# Patient Record
Sex: Male | Born: 1950 | Race: Black or African American | Hispanic: No | Marital: Married | Smoking: Former smoker
Health system: Southern US, Community
[De-identification: ages and names within clinical notes are randomized; demographics above are authoritative.]

## PROBLEM LIST (undated history)

## (undated) DIAGNOSIS — E785 Hyperlipidemia, unspecified: Secondary | ICD-10-CM

## (undated) DIAGNOSIS — Z9289 Personal history of other medical treatment: Secondary | ICD-10-CM

## (undated) DIAGNOSIS — M1990 Unspecified osteoarthritis, unspecified site: Secondary | ICD-10-CM

## (undated) DIAGNOSIS — Z8719 Personal history of other diseases of the digestive system: Secondary | ICD-10-CM

## (undated) DIAGNOSIS — I2510 Atherosclerotic heart disease of native coronary artery without angina pectoris: Secondary | ICD-10-CM

## (undated) DIAGNOSIS — I10 Essential (primary) hypertension: Secondary | ICD-10-CM

## (undated) DIAGNOSIS — K219 Gastro-esophageal reflux disease without esophagitis: Secondary | ICD-10-CM

## (undated) DIAGNOSIS — T7840XA Allergy, unspecified, initial encounter: Secondary | ICD-10-CM

## (undated) HISTORY — PX: BACK SURGERY: SHX140

## (undated) HISTORY — DX: Allergy, unspecified, initial encounter: T78.40XA

## (undated) HISTORY — DX: Unspecified osteoarthritis, unspecified site: M19.90

---

## 2002-08-09 ENCOUNTER — Encounter: Payer: Self-pay | Admitting: Cardiology

## 2002-08-09 ENCOUNTER — Encounter: Admission: RE | Admit: 2002-08-09 | Discharge: 2002-08-09 | Payer: Self-pay | Admitting: Cardiology

## 2003-09-04 ENCOUNTER — Encounter: Admission: RE | Admit: 2003-09-04 | Discharge: 2003-09-04 | Payer: Self-pay | Admitting: Cardiology

## 2004-10-06 DIAGNOSIS — Z9289 Personal history of other medical treatment: Secondary | ICD-10-CM

## 2004-10-06 HISTORY — PX: POSTERIOR LUMBAR FUSION: SHX6036

## 2004-10-06 HISTORY — DX: Personal history of other medical treatment: Z92.89

## 2004-10-06 HISTORY — PX: CARDIAC CATHETERIZATION: SHX172

## 2004-11-28 ENCOUNTER — Inpatient Hospital Stay (HOSPITAL_COMMUNITY): Admission: RE | Admit: 2004-11-28 | Discharge: 2004-11-30 | Payer: Self-pay | Admitting: Neurosurgery

## 2009-06-07 ENCOUNTER — Encounter: Admission: RE | Admit: 2009-06-07 | Discharge: 2009-06-07 | Payer: Self-pay | Admitting: Neurosurgery

## 2011-02-21 NOTE — Op Note (Signed)
NAME:  Billy Riley, Billy Riley             ACCOUNT NO.:  [id number]   MEDICAL RECORD NO.:  [id number]          PATIENT TYPE:  INP   LOCATION:  2861                         FACILITY:  [facility]   PHYSICIAN:  Hewitt Shorts, M.D.DATE OF BIRTH:  05/02/1951   DATE OF PROCEDURE:  11/28/2004  DATE OF DISCHARGE:                                 OPERATIVE REPORT   PREOPERATIVE DIAGNOSIS:  Lumbar spondylosis, lumbar degenerative disc  disease, lumbar radiculopathy, lumbar facet arthropathy, and lumbar neural  foraminal stenosis.   POSTOPERATIVE DIAGNOSIS:  Lumbar spondylosis, lumbar degenerative disc  disease, lumbar radiculopathy, lumbar facet arthropathy, and lumbar neural  foraminal stenosis.   PROCEDURE:  L5 Gill procedure, L5-S1 posterior lumbar interbody fusion with  AVS PEEK interbody implants and VITOSS with bone marrow aspirate, and  posterolateral arthrodesis with 90D posterior instrumentation and VITOSS  with bone marrow aspirate.   SURGEON:  Hewitt Shorts, M.D.   ASSISTANT:  Stefani Dama, M.D.   ANESTHESIA:  General endotracheal anesthesia.   INDICATIONS FOR PROCEDURE:  The patient is a 60 year old man who presented  with right lumbar radiculopathy who was found to have multilevel  degenerative disc disease with spondylosis with the worse degenerative  changes seen at the L5-S1 level and particularly, involving the right L5-S1  facet complex which was hypertrophic and in conjunction with spondylotic  degenerative disc protrusion which was causing severe right L5-S1 neural  foraminal encroachment or stenosis with right L5 nerve root compression.  The decision was made to proceed with decompression and arthrodesis.   DESCRIPTION OF PROCEDURE:  The patient was brought to the operating room and  placed under general endotracheal anesthesia.  The patient was turned to the  prone position and the lumbar region was prepped with Betadine soaping  solution and draped in a sterile  fashion.  The midline was infiltrated with  local anesthetic with epinephrine.  A midline incision was made and carried  down through the subcutaneous tissue.  Bipolar cautery and electrocautery  were used to maintain hemostasis.  Dissection was carried down to the lumbar  fascia which was incised bilaterally.  The paraspinal muscles were dissected  from the spinous process and lamina in a subperiosteal fashion.  The L5-S1  level was identified and an x-ray was taken to confirm the localization.  Then dissection was carried out laterally over the facet complexes, a self-  retaining retractor was placed, and then the microscope was draped and  brought onto the field to provide additional magnification, illumination,  and visualization, and the remainder of the decompression was performed  using microdissection and microsurgical technique.  Laminectomy and  facetectomy was performed with particular attention paid to the right L5-S1  facet complex which was markedly hypertrophic and dysmorphic.  We identified  the thecal sac, the ligamentum flavum was removed.  We identified the  exiting L5 and S1 nerve roots and we decompressed L5 nerve root as it exited  through the neural foramen removing hypertrophic facet, thereby,  decompressing the nerve.  We then further decompressed the nerve by  proceeding with a bilateral L5-S1 discectomy.  There was  spondylitic  overgrowth and broad based spondylotic disc protrusion, posterior  osteophytic overgrowth was removed using an osteophyte removal tool, the  disc space was entered and thorough discectomy performed removing all loose  fragments of disc material and as this was performed, we were able to  decompress the exiting right L5 nerve root both ventrally and dorsally.  We  then further prepared the disc spaces for the arthrodesis.  The  cartilaginous endplates of the corresponding vertebra were removed and  posterior osteophytic overgrowth was  removed.  We then sized the disc spaces  and selected 8 mm by 20 mm implants with 4 degrees of lordosis.  We then  identified the pedicles of S1, each of the pedicles was probed, and bone  marrow aspirate was aspirated and we injected this in 10 mL of VITOSS foam.  This was saturated with the bone marrow aspirate and took the VITOSS with  bone marrow aspirate and hacked into the PEEK interbody cages.  These cages  were then gently positioned in the L5-S1 disc spaces and carefully retracted  the thecal sac and nerve roots.  Each of the implants were counter sunk and  then we proceeded with the posterolateral arthrodesis.  We identified the  ala of S1 and the transverse processes of L5 which was decorticated.  We  then draped the C-arm fluoroscopic unit and it was brought onto the field.  We checked the positioning of our pedicle probes at S1.  These pedicles were  cannulated with a ball probe, no cut outs were found.  Each limb was tapped  with a 5.25 tap under C-arm fluoroscopic guidance.  Then, we placed 6.75 by  40 mm polyaxial screws bilaterally.  Then using C-arm fluoroscopic guidance,  we identified the pedicle entry site at L5, the overlying cortex was opened  and we probed the pedicles bilaterally at L5.  Again, no cut outs were  found.  They were tapped with a 5.25 tap and then we placed bilateral 6.75  by 40 mm screws at L5.  After tapping, each of the pedicles was checked and  good threading was found and no cut outs were found.  Once all four screws  were in place, we selected a 60 mm rod, it was cut roughly in half, and then  the rods were placed within the screw heads and the locking caps were  placed.  These were tightened against counter torque.  We then carefully  packed the additional VITOSS with bone aspirate in the lateral gutters  bilaterally.  Once that was completed, we examined the spinal canal one last time.  There remained good decompression of the thecal sac and  exiting nerve  roots, and then we proceeded with closure.  The deep fascia was closed with  interrupted undyed #1 Vicryl suture, the subcutaneous and subcuticular layer  was closed with interrupted inverted 2-0 Vicryl sutures, and the skin was  reapproximated with Dermabond.  The patient tolerated the procedure well.  Estimated blood loss was 300 mL.  We were able to give back to the patient  100 mL of Cell Saver blood.  Sponge and needle counts were correct.  Following surgery, the patient was turned back to the supine position,  reversed from the anesthetic, extubated, and transferred to the recovery  room for further care.      RWN/MEDQ  D:  11/28/2004  T:  11/28/2004  Job:  [id number]

## 2011-02-21 NOTE — H&P (Signed)
NAME:  Billy Riley, Billy Riley NO.:  [id number]   MEDICAL RECORD NO.:  [id number]          PATIENT TYPE:  INP   LOCATION:  2861                         FACILITY:  [facility]   PHYSICIAN:  Hewitt Shorts, M.D.DATE OF BIRTH:  05-05-1951   DATE OF ADMISSION:  11/28/2004  DATE OF DISCHARGE:                                HISTORY & PHYSICAL   HISTORY OF PRESENT ILLNESS:  The patient is a 60 year old right-handed black  male who was evaluated for a right lumbar radiculopathy.  He explains that  he has had low back and right lower extremity pain for the past three years  that has tended to come and go.  He has undergone chiropractic treatment  which would tend to help, but the benefit would wear off after just a few  days.  It has become less and less effective for him as well gradually over  time.  He has developed numbness and tingling that would run down through  the right lower extremity.  He describes the pain across the lower back into  the right side of the low back and into the right buttock, posterior thigh  and leg, numbness and tingling to the right buttock, posterior thigh and  leg, and occasionally a sense of weakness in the lower extremities.   The patient was evaluated with an MRI scan that shows moderately-advanced  degenerative joint disease and spondylosis at L4-5 and L5-S1 with  desiccation of the disks and circumferential disk bulging; however, there is  significant right L5-S1 facet arthropathy with significant right L5-S1  neural foraminal encroachment compressing the exiting right L5 nerve roots.  The patient is admitted now for L5 Gill procedure, L5-S1 posterior lumbar  interbody fusion and L5-S1 posterolateral arthrodesis with interbody  implants and posterior instrumentation.   PAST MEDICAL HISTORY:  Notable for a history of hiatal hernia, treated with  Protonix.  No history of hypertension, myocardial infarction, cancer,  stroke, diabetes or lung  disease.  No previous surgeries.   He has no allergies to medications, but aspirin can aggravate his  gastroesophageal reflux disease.   CURRENT MEDICATIONS:  1.  Protonix 40 mg daily.  2.  Flomax 0.4 mg daily.   FAMILY HISTORY:  His parents have passed on.   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  He smokes about four cigarettes a day.  He has been smoking  for 40 years.  He does not drink alcohol and denies a history of substance  abuse.   REVIEW OF SYSTEMS:  Notable for as described in his history of present  illness and past medical history but is otherwise unremarkable.   PHYSICAL EXAMINATION:  GENERAL:  The patient is a well-developed, well-  nourished black male in no acute distress.  VITAL SIGNS:  Temperature is 97.4, pulse 63, blood pressure 128/85,  respiratory rate 18.  Height is 5 feet 10 inches, weight 200 pounds.  CHEST:  Lungs are clear to auscultation.  His symmetrical respiratory  excursion.  CARDIAC:  Heart is a regular rate and rhythm, S1, S2.  There is no murmur.  ABDOMEN:  Soft, nontender, bowel sounds present.  EXTREMITIES:  No clubbing, cyanosis, or edema.  MUSCULOSKELETAL:  No particular tenderness to palpation of the lumbar  spinous processes or paralumbar musculature.  He is able to flex 90 degrees,  able to extend, but he has discomfort with both flexion and extension.  Straight leg raising is negative bilaterally, although on the right side  there was more tightness in the right lower extremity than in the left side.  NEUROLOGIC:  5/5 strength of the dorsiflexors, plantar flexors, extensor  hallucis longus bilaterally.  Sensation is intact to pinprick to the distal  lower extremities.  His reflexes are trace in the quadriceps and  gastrocnemius and symmetrical bilaterally.  The toes are downgoing  bilaterally.  He has a normal gait and stance.   IMPRESSION:  Low back and right lumbar radicular pain  secondary to right L5-  S1 neural foraminal encroachment with overlying facet arthropathy.  He  understands that the low back pain could be coming from degenerative changes  not only at the L5-S1 level but also at L4-5.   PLAN:  We discussed a variety of his options for treatment, including  medical treatment with NSAIDs, a course of spinal injections, or surgical  decompression and arthrodesis.  After discussing this thoroughly on several  occasions, the patient does wish to proceed with surgery and is admitted for  an L5 Gill procedure, L5-S1 posterior lumbar interbody fusion with interbody  implants and bone graft and L5-S1 posterolateral arthrodesis with posterior  instrumentation and bone graft.  Therefore, I have discussed the  difficulties of surgery, hospital stay and overall recuperation, limitations  postoperatively, the need for postoperative mobilization in a lumbar corset,  and risks of surgery including risks of infection, bleeding, possible need  for transfusion, the risk of nerve dysfunction with pain, weakness, numbness  or paresthesias, the risk of dural tear and CSF leakage with possible need  for further surgery, the risk of failure of the arthrodesis, particularly in  light of his smoking history, and the possible need for further surgery, and  anesthetic risks of myocardial infarction, stroke, pneumonia of death, again  all of which are increased due to his smoking history.  He has been strongly  encouraged to quit smoking, and he said that he would cease smoking.   He does understand that we will use a Cell Saver during the surgery, which  reduces the likelihood of requiring transfusion from the blood bank.  Understanding all this, he does wish to go ahead with surgery and is  admitted for such.      RWN/MEDQ  D:  11/28/2004  T:  11/28/2004  Job:  [id number]

## 2012-10-13 ENCOUNTER — Ambulatory Visit (INDEPENDENT_AMBULATORY_CARE_PROVIDER_SITE_OTHER): Payer: BC Managed Care – PPO | Admitting: Family Medicine

## 2012-10-13 ENCOUNTER — Encounter: Payer: Self-pay | Admitting: Family Medicine

## 2012-10-13 VITALS — BP 140/90 | HR 77 | Temp 98.2°F | Resp 16 | Ht 69.0 in | Wt 195.8 lb

## 2012-10-13 DIAGNOSIS — R202 Paresthesia of skin: Secondary | ICD-10-CM

## 2012-10-13 DIAGNOSIS — M5137 Other intervertebral disc degeneration, lumbosacral region: Secondary | ICD-10-CM

## 2012-10-13 DIAGNOSIS — F17200 Nicotine dependence, unspecified, uncomplicated: Secondary | ICD-10-CM

## 2012-10-13 DIAGNOSIS — M5136 Other intervertebral disc degeneration, lumbar region: Secondary | ICD-10-CM | POA: Insufficient documentation

## 2012-10-13 DIAGNOSIS — M51379 Other intervertebral disc degeneration, lumbosacral region without mention of lumbar back pain or lower extremity pain: Secondary | ICD-10-CM

## 2012-10-13 DIAGNOSIS — Z131 Encounter for screening for diabetes mellitus: Secondary | ICD-10-CM

## 2012-10-13 DIAGNOSIS — Z720 Tobacco use: Secondary | ICD-10-CM | POA: Insufficient documentation

## 2012-10-13 DIAGNOSIS — M79671 Pain in right foot: Secondary | ICD-10-CM

## 2012-10-13 DIAGNOSIS — R209 Unspecified disturbances of skin sensation: Secondary | ICD-10-CM

## 2012-10-13 DIAGNOSIS — M79609 Pain in unspecified limb: Secondary | ICD-10-CM

## 2012-10-13 LAB — POCT GLYCOSYLATED HEMOGLOBIN (HGB A1C): Hemoglobin A1C: 5.6

## 2012-10-13 LAB — GLUCOSE, POCT (MANUAL RESULT ENTRY): POC Glucose: 103 mg/dl — AB (ref 70–99)

## 2012-10-13 NOTE — Patient Instructions (Addendum)
I would suggest you see a podiatrist about your toenails as well as a complete foot evaluation; you may have a problem of the forefoot that can be corrected with better footwear or orthotics. Xrays of the feet can uncover some specific problem like a pinched nerve. Continue taking Advil for pain and wear cotton socks that can absorb excess sweat and keep feet cool.  If the foot specialist can not detect a reason for your foot problem, you need to return to our office for possible study of blood vessels in legs (tobacco use can cause disease in your blood vessels leading to poor circulation).

## 2012-10-13 NOTE — Progress Notes (Signed)
S:  This 62 y.o. AA has not been seen at [facility] in 5 years. 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 He presents w/ burning in feet (R>L) for about 1 month; the discomfort is localized to just beneath his great toe on right foot. Advil does relieve pain. Feet sweat a lot and removing shoes does help feet cool off. Wants to be checked for Diabetes. Pt is a smoker for 40 years. He does have cramping in right calf after walking for a few blocks; pt denies any change of skin color or clod sensation in feet or toes. He has hx of lumbar DDD and is s/p surgery (Dr. Newell Coral) with cadaver bone in Feb 2006.  ROS: Negative for unexplained weight change or appetite loss, fever, abnormal sensations in arms or upper legs, HA, dizziness, weakness, numbness, tremor, gait disturbance or syncope. HE denies any exposure to noxious or toxic substances.  O:  Filed Vitals:   10/13/12 1019  BP: 140/90  Pulse: 77  Temp: 98.2 F (36.8 C)  Resp: 16   GNE: In NAD; WN,WD. HENT: [place]/AT; EOMI w/ clear conj and muddy sclerae. EACs normal. Otherwise unremarkable. NECK: Supple w/o LAN or TMG. COR: RRR. Distal p ulses normal (DP 2+/=). LUNGS: Normal resp rate and effort. MS: Good ROM; no deformities noted. No calf tenderness.Feet- Normal appearance w/o ulcerations or other lesions. Tips of toes feel cool to touch. SKIN: Nails on both great toes- thick and discolored/ deformed. NEURO: A&O x 3; CNs intact. Nonfocal. Gait is normal.   Results for orders placed in visit on 10/13/12  POCT GLYCOSYLATED HEMOGLOBIN (HGB A1C)      Component Value Range   Hemoglobin A1C 5.6    GLUCOSE, POCT (MANUAL RESULT ENTRY)      Component Value Range   POC Glucose 103 (*) 70 - 99 mg/dl     A/P: 1. Paresthesias - may be related to previous DDD POCT glycosylated hemoglobin (Hb A1C), POCT glucose (manual entry)  2. Tobacco user  Advised pt that further evaluation may be necessary if Podiatry cannot  determine etiology for discomfort  3. DDD (degenerative disc disease), lumbar    4. Foot pain, right  Ambulatory referral to Podiatry

## 2012-12-05 ENCOUNTER — Ambulatory Visit (INDEPENDENT_AMBULATORY_CARE_PROVIDER_SITE_OTHER): Payer: BC Managed Care – PPO | Admitting: Emergency Medicine

## 2012-12-05 VITALS — BP 150/82 | HR 80 | Temp 98.0°F | Resp 18 | Ht 69.0 in | Wt 196.2 lb

## 2012-12-05 DIAGNOSIS — L03319 Cellulitis of trunk, unspecified: Secondary | ICD-10-CM

## 2012-12-05 DIAGNOSIS — L02219 Cutaneous abscess of trunk, unspecified: Secondary | ICD-10-CM

## 2012-12-05 MED ORDER — CIPROFLOXACIN HCL 500 MG PO TABS: 500.0000 mg | ORAL_TABLET | Freq: Two times a day (BID) | ORAL | Status: DC

## 2012-12-05 MED ORDER — SULFAMETHOXAZOLE-TRIMETHOPRIM 800-160 MG PO TABS: 1.0000 | ORAL_TABLET | Freq: Two times a day (BID) | ORAL | Status: DC

## 2012-12-05 NOTE — Patient Instructions (Addendum)
Abscess An abscess is an infected area that contains a collection of pus and debris. It can occur in almost any part of the body. An abscess is also known as a furuncle or boil. CAUSES   An abscess occurs when tissue gets infected. This can occur from blockage of oil or sweat glands, infection of hair follicles, or a minor injury to the skin. As the body tries to fight the infection, pus collects in the area and creates pressure under the skin. This pressure causes pain. People with weakened immune systems have difficulty fighting infections and get certain abscesses more often.   SYMPTOMS Usually an abscess develops on the skin and becomes a painful mass that is red, warm, and tender. If the abscess forms under the skin, you may feel a moveable soft area under the skin. Some abscesses break open (rupture) on their own, but most will continue to get worse without care. The infection can spread deeper into the body and eventually into the bloodstream, causing you to feel ill.   DIAGNOSIS   Your caregiver will take your medical history and perform a physical exam. A sample of fluid may also be taken from the abscess to determine what is causing your infection. TREATMENT   Your caregiver may prescribe antibiotic medicines to fight the infection. However, taking antibiotics alone usually does not cure an abscess. Your caregiver may need to make a small cut (incision) in the abscess to drain the pus. In some cases, gauze is packed into the abscess to reduce pain and to continue draining the area. HOME CARE INSTRUCTIONS    Only take over-the-counter or prescription medicines for pain, discomfort, or fever as directed by your caregiver.   If you were prescribed antibiotics, take them as directed. Finish them even if you start to feel better.   If gauze is used, follow your caregiver's directions for changing the gauze.   To avoid spreading the infection:   Keep your draining abscess covered with a  bandage.   Wash your hands well.   Do not share personal care items, towels, or whirlpools with others.   Avoid skin contact with others.   Keep your skin and clothes clean around the abscess.   Keep all follow-up appointments as directed by your caregiver.  SEEK MEDICAL CARE IF:    You have increased pain, swelling, redness, fluid drainage, or bleeding.   You have muscle aches, chills, or a general ill feeling.   You have a fever.  MAKE SURE YOU:    Understand these instructions.   Will watch your condition.   Will get help right away if you are not doing well or get worse.  Document Released: 07/02/2005 Document Revised: 03/23/2012 Document Reviewed: 12/05/2011 [employer] Patient Information 2013 [employer].    

## 2012-12-05 NOTE — Progress Notes (Signed)
[facility] [address] [place] [address] [phone]  Date:  12/05/2012   Name:  Billy Riley   DOB:  November 08, 1950   MRN:  [id number]  PCP:  No primary provider on file.    Chief Complaint: Boil   History of Present Illness:  Billy Riley is a 62 y.o. very pleasant male patient who presents with the following  Has a swollen, tender mass on the right perineum over past several days  No fever or chills.  No nausea or vomiting.  No GU or GI symptoms.   Patient Active Problem List  Diagnosis  . DDD (degenerative disc disease), lumbar  . Tobacco user    Past Medical History  Diagnosis Date  . Allergy   . Arthritis   . Blood transfusion without reported diagnosis     Past Surgical History  Procedure Laterality Date  . Spine surgery      History  Substance Use Topics  . Smoking status: Current Every Day Smoker  . Smokeless tobacco: Not on file  . Alcohol Use: No    Family History  Problem Relation Age of Onset  . Cancer Mother     breast  . Heart disease Father     heart attack  . Cancer Sister     breast  . Heart disease Brother     heart attack  . Diabetes Sister     Allergies  Allergen Reactions  . Asa (Aspirin)     Burns stomach    Medication list has been reviewed and updated.  Current Outpatient Prescriptions on File Prior to Visit  Medication Sig Dispense Refill  . OVER THE COUNTER MEDICATION Saw Palmetto 900 mg taking      . Nutritional Supplements (FEEDING SUPPLEMENT, GLUCERNA 1.2 CAL,) LIQD Place 1,000 mLs into feeding tube continuous.      Marland Kitchen OVER THE COUNTER MEDICATION Glucosamine 1000 mg taking      . OVER THE COUNTER MEDICATION Garlic Tablets 657 mg taking      . ranitidine (ZANTAC) 150 MG tablet Take 150 mg by mouth 2 (two) times daily.       No current facility-administered medications on file prior to visit.    Review of Systems:  As per HPI, otherwise negative.    Physical  Examination: Filed Vitals:   12/05/12 1232  BP: 150/82  Pulse: 80  Temp: 98 F (36.7 C)  Resp: 18   Filed Vitals:   12/05/12 1232  Height: 5\' 9"  (1.753 m)  Weight: 196 lb 3.2 oz (88.996 kg)   Body mass index is 28.96 kg/(m^2). Ideal Body Weight: Weight in (lb) to have BMI = 25: 168.9   GEN: WDWN, NAD, Non-toxic, Alert & Oriented x 3 HEENT: Atraumatic, Normocephalic.  Ears and Nose: No external deformity. EXTR: No clubbing/cyanosis/edema NEURO: Normal gait.  PSYCH: Normally interactive. Conversant. Not depressed or anxious appearing.  Calm demeanor.  PERINEUM:  Tender fluctuant abscess right perineum. GENITALIA:  Normal male circumcised   Assessment and Plan: Perineal abscess.  Refused I&D Septra CIPRO Warm soaks Follow up as needed  Carmelina Dane, MD

## 2015-01-08 ENCOUNTER — Ambulatory Visit (INDEPENDENT_AMBULATORY_CARE_PROVIDER_SITE_OTHER): Payer: BC Managed Care – PPO | Admitting: Physician Assistant

## 2015-01-08 VITALS — BP 140/90 | HR 74 | Temp 98.0°F | Resp 16 | Ht 70.0 in | Wt 196.2 lb

## 2015-01-08 DIAGNOSIS — B9789 Other viral agents as the cause of diseases classified elsewhere: Secondary | ICD-10-CM

## 2015-01-08 DIAGNOSIS — J029 Acute pharyngitis, unspecified: Secondary | ICD-10-CM | POA: Diagnosis not present

## 2015-01-08 DIAGNOSIS — J069 Acute upper respiratory infection, unspecified: Secondary | ICD-10-CM

## 2015-01-08 LAB — POCT CBC
GRANULOCYTE PERCENT: 62.6 % (ref 37–80)
HCT, POC: 46.7 % (ref 43.5–53.7)
Hemoglobin: 15.3 g/dL (ref 14.1–18.1)
Lymph, poc: 3.7 — AB (ref 0.6–3.4)
MCH, POC: 32.6 pg — AB (ref 27–31.2)
MCHC: 32.7 g/dL (ref 31.8–35.4)
MCV: 99.5 fL — AB (ref 80–97)
MID (CBC): 0.1 (ref 0–0.9)
MPV: 7.5 fL (ref 0–99.8)
PLATELET COUNT, POC: 259 10*3/uL (ref 142–424)
POC GRANULOCYTE: 6.4 (ref 2–6.9)
POC LYMPH %: 36.2 % (ref 10–50)
POC MID %: 1.2 %M (ref 0–12)
RBC: 4.69 M/uL (ref 4.69–6.13)
RDW, POC: 16.4 %
WBC: 10.2 10*3/uL (ref 4.6–10.2)

## 2015-01-08 LAB — POCT RAPID STREP A (OFFICE): Rapid Strep A Screen: NEGATIVE

## 2015-01-08 MED ORDER — IBUPROFEN 400 MG PO TABS: 400.0000 mg | ORAL_TABLET | Freq: Three times a day (TID) | ORAL | Status: DC | PRN

## 2015-01-08 MED ORDER — HYDROCODONE-HOMATROPINE 5-1.5 MG/5ML PO SYRP: 5.0000 mL | ORAL_SOLUTION | Freq: Three times a day (TID) | ORAL | Status: DC | PRN

## 2015-01-08 MED ORDER — ACETAMINOPHEN 500 MG PO TABS: 500.0000 mg | ORAL_TABLET | Freq: Four times a day (QID) | ORAL | Status: DC | PRN

## 2015-01-08 NOTE — Progress Notes (Signed)
01/08/2015 at 4:15 PM  Billy Riley / DOB: [birth year] / MRN: [id number]  The patient has DDD (degenerative disc disease), lumbar and Tobacco user on his problem list.  SUBJECTIVE  Chief complaint: Sore Throat   History of present illness: Billy Riley is 64 y.o. well appearing male presenting for sore throat that started three days ago.  He reports the pain as moderate and left sided.  He has been eating and drinking normally despite the pain.  He has tried several OTC medications with mild relief. He denies fever, but reports mild cough and a history of allergies. He thought his symptoms may be due to allergies, but felt he should come in because he usually does not suffer from sore throat during allergy season.    He  has a past medical history of Allergy; Arthritis; and Blood transfusion without reported diagnosis.    He has a current medication list which includes the following prescription(s): OVER THE COUNTER MEDICATION, OVER THE COUNTER MEDICATION, OVER THE COUNTER MEDICATION, ranitidine, acetaminophen, hydrocodone-homatropine, ibuprofen, and feeding supplement (glucerna 1.2 cal).  Billy Riley is allergic to asa. He  reports that he has been smoking.  He does not have any smokeless tobacco history on file. He reports that he does not drink alcohol or use illicit drugs. He  reports that he currently engages in sexual activity. The patient  has past surgical history that includes Spine surgery.  His family history includes Cancer in his mother and sister; Diabetes in his sister; Heart disease in his brother and father.  Review of Systems  Constitutional: Negative for fever, chills and weight loss.  Respiratory: Positive for cough. Negative for shortness of breath and wheezing.   Cardiovascular: Negative for chest pain and palpitations.  Gastrointestinal: Negative for heartburn.  Genitourinary: Negative.   Musculoskeletal: Negative.   Skin: Negative.   Neurological: Negative  for dizziness and headaches.  Endo/Heme/Allergies: Positive for environmental allergies.    OBJECTIVE  His  height is  (1.778 m) and weight is 196 lb 3.2 oz (88.996 kg). His oral temperature is 98 F (36.7 C). His blood pressure is 140/90 and his pulse is 74. His respiration is 16 and oxygen saturation is 98%.  The patient's body mass index is 28.15 kg/(m^2).  Physical Exam  Vitals reviewed. Constitutional: He is oriented to person, place, and time. He appears well-developed and well-nourished. No distress.  HENT:  Right Ear: Hearing, tympanic membrane, external ear and ear canal normal.  Left Ear: Hearing, tympanic membrane, external ear and ear canal normal.  Nose: No mucosal edema or sinus tenderness. Right sinus exhibits no maxillary sinus tenderness and no frontal sinus tenderness. Left sinus exhibits no maxillary sinus tenderness and no frontal sinus tenderness.  Mouth/Throat: Uvula is midline and mucous membranes are normal. Mucous membranes are not pale, not dry and not cyanotic. Posterior oropharyngeal erythema present. No oropharyngeal exudate, posterior oropharyngeal edema or tonsillar abscesses.  Cardiovascular: Normal rate and regular rhythm.   Respiratory: Effort normal and breath sounds normal. He has no wheezes. He has no rales.  Musculoskeletal: Normal range of motion.  Lymphadenopathy:    He has no cervical adenopathy.  Neurological: He is alert and oriented to person, place, and time.  Skin: Skin is warm and dry. He is not diaphoretic.  Psychiatric: He has a normal mood and affect.    Results for orders placed or performed in visit on 01/08/15 (from the past 24 hour(s))  POCT CBC  Status: Abnormal   Collection Time: 01/08/15  3:49 PM  Result Value Ref Range   WBC 10.2 4.6 - 10.2 K/uL   Lymph, poc 3.7 (A) 0.6 - 3.4   POC LYMPH PERCENT 36.2 10 - 50 %L   MID (cbc) 0.1 0 - 0.9   POC MID % 1.2 0 - 12 %M   POC Granulocyte 6.4 2 - 6.9   Granulocyte percent  62.6 37 - 80 %G   RBC 4.69 4.69 - 6.13 M/uL   Hemoglobin 15.3 14.1 - 18.1 g/dL   HCT, POC 04.546.7 40.943.5 - 53.7 %   MCV 99.5 (A) 80 - 97 fL   MCH, POC 32.6 (A) 27 - 31.2 pg   MCHC 32.7 31.8 - 35.4 g/dL   RDW, POC 81.116.4 %   Platelet Count, POC 259 142 - 424 K/uL   MPV 7.5 0 - 99.8 fL  POCT rapid strep A     Status: None   Collection Time: 01/08/15  3:49 PM  Result Value Ref Range   Rapid Strep A Screen Negative Negative    ASSESSMENT & PLAN  Billy Riley was seen today for sore throat.  Diagnoses and all orders for this visit:  Sore throat: Exam and vitals unremarkable. CBC with diff pointing in a viral direction given mild absolute lymphocytosis.  Will treat for a viral etiology with the below.  Throat culture pending.  Orders: -     POCT CBC -     POCT rapid strep A -     Culture, Group A Strep -     acetaminophen (TYLENOL) 500 MG tablet; Take 1 tablet (500 mg total) by mouth every 6 (six) hours as needed. -     ibuprofen (ADVIL,MOTRIN) 400 MG tablet; Take 1 tablet (400 mg total) by mouth every 8 (eight) hours as needed.  Viral URI with cough Orders: -     HYDROcodone-homatropine (HYCODAN) 5-1.5 MG/5ML syrup; Take 5 mLs by mouth every 8 (eight) hours as needed for cough.    The patient was advised to call or come back to clinic if he does not see an improvement in symptoms, or worsens with the above plan.   Deliah BostonMichael Ras Kollman, MHS, PA-C [facility] 01/08/2015 4:15 PM

## 2015-01-10 LAB — CULTURE, GROUP A STREP: Organism ID, Bacteria: NORMAL

## 2016-01-20 ENCOUNTER — Inpatient Hospital Stay (HOSPITAL_COMMUNITY)
Admission: EM | Admit: 2016-01-20 | Discharge: 2016-01-22 | DRG: 251 | Disposition: A | Discharge status: Home / Self Care | Payer: BC Managed Care – PPO | Attending: Internal Medicine | Admitting: Internal Medicine

## 2016-01-20 ENCOUNTER — Encounter (HOSPITAL_COMMUNITY): Payer: Self-pay | Admitting: Emergency Medicine

## 2016-01-20 ENCOUNTER — Emergency Department (HOSPITAL_COMMUNITY): Payer: BC Managed Care – PPO

## 2016-01-20 DIAGNOSIS — Z716 Tobacco abuse counseling: Secondary | ICD-10-CM

## 2016-01-20 DIAGNOSIS — Z8249 Family history of ischemic heart disease and other diseases of the circulatory system: Secondary | ICD-10-CM

## 2016-01-20 DIAGNOSIS — Z87891 Personal history of nicotine dependence: Secondary | ICD-10-CM | POA: Diagnosis present

## 2016-01-20 DIAGNOSIS — M51369 Other intervertebral disc degeneration, lumbar region without mention of lumbar back pain or lower extremity pain: Secondary | ICD-10-CM | POA: Diagnosis present

## 2016-01-20 DIAGNOSIS — K449 Diaphragmatic hernia without obstruction or gangrene: Secondary | ICD-10-CM | POA: Diagnosis present

## 2016-01-20 DIAGNOSIS — F17200 Nicotine dependence, unspecified, uncomplicated: Secondary | ICD-10-CM | POA: Diagnosis present

## 2016-01-20 DIAGNOSIS — I209 Angina pectoris, unspecified: Secondary | ICD-10-CM | POA: Diagnosis present

## 2016-01-20 DIAGNOSIS — I2510 Atherosclerotic heart disease of native coronary artery without angina pectoris: Secondary | ICD-10-CM | POA: Diagnosis present

## 2016-01-20 DIAGNOSIS — I161 Hypertensive emergency: Secondary | ICD-10-CM | POA: Diagnosis present

## 2016-01-20 DIAGNOSIS — Z803 Family history of malignant neoplasm of breast: Secondary | ICD-10-CM

## 2016-01-20 DIAGNOSIS — I252 Old myocardial infarction: Secondary | ICD-10-CM | POA: Diagnosis present

## 2016-01-20 DIAGNOSIS — Z7902 Long term (current) use of antithrombotics/antiplatelets: Secondary | ICD-10-CM

## 2016-01-20 DIAGNOSIS — Z888 Allergy status to other drugs, medicaments and biological substances status: Secondary | ICD-10-CM

## 2016-01-20 DIAGNOSIS — E785 Hyperlipidemia, unspecified: Secondary | ICD-10-CM | POA: Diagnosis present

## 2016-01-20 DIAGNOSIS — I160 Hypertensive urgency: Secondary | ICD-10-CM | POA: Diagnosis present

## 2016-01-20 DIAGNOSIS — I214 Non-ST elevation (NSTEMI) myocardial infarction: Secondary | ICD-10-CM | POA: Diagnosis not present

## 2016-01-20 DIAGNOSIS — R011 Cardiac murmur, unspecified: Secondary | ICD-10-CM | POA: Diagnosis present

## 2016-01-20 DIAGNOSIS — R079 Chest pain, unspecified: Secondary | ICD-10-CM

## 2016-01-20 DIAGNOSIS — K219 Gastro-esophageal reflux disease without esophagitis: Secondary | ICD-10-CM | POA: Diagnosis present

## 2016-01-20 DIAGNOSIS — Z720 Tobacco use: Secondary | ICD-10-CM | POA: Diagnosis present

## 2016-01-20 DIAGNOSIS — I358 Other nonrheumatic aortic valve disorders: Secondary | ICD-10-CM

## 2016-01-20 DIAGNOSIS — R778 Other specified abnormalities of plasma proteins: Secondary | ICD-10-CM | POA: Diagnosis present

## 2016-01-20 DIAGNOSIS — Z9861 Coronary angioplasty status: Secondary | ICD-10-CM

## 2016-01-20 DIAGNOSIS — R7989 Other specified abnormal findings of blood chemistry: Secondary | ICD-10-CM | POA: Diagnosis present

## 2016-01-20 DIAGNOSIS — M5136 Other intervertebral disc degeneration, lumbar region: Secondary | ICD-10-CM | POA: Diagnosis present

## 2016-01-20 HISTORY — DX: Essential (primary) hypertension: I10

## 2016-01-20 HISTORY — DX: Gastro-esophageal reflux disease without esophagitis: K21.9

## 2016-01-20 HISTORY — DX: Personal history of other medical treatment: Z92.89

## 2016-01-20 HISTORY — DX: Personal history of other diseases of the digestive system: Z87.19

## 2016-01-20 HISTORY — DX: Atherosclerotic heart disease of native coronary artery without angina pectoris: I25.10

## 2016-01-20 HISTORY — DX: Hyperlipidemia, unspecified: E78.5

## 2016-01-20 LAB — BASIC METABOLIC PANEL
Anion gap: 12 (ref 5–15)
BUN: 8 mg/dL (ref 6–20)
CO2: 24 mmol/L (ref 22–32)
Calcium: 9.3 mg/dL (ref 8.9–10.3)
Chloride: 103 mmol/L (ref 101–111)
Creatinine, Ser: 1.27 mg/dL — ABNORMAL HIGH (ref 0.61–1.24)
GFR calc Af Amer: >60 mL/min (ref >60)
GFR calc non Af Amer: 58 mL/min — ABNORMAL LOW (ref >60)
GLUCOSE: 147 mg/dL — AB (ref 65–99)
Potassium: 3.7 mmol/L (ref 3.5–5.1)
Sodium: 139 mmol/L (ref 135–145)

## 2016-01-20 LAB — CBC
HCT: 41.6 % (ref 39.0–52.0)
HEMOGLOBIN: 13.9 g/dL (ref 13.0–17.0)
MCH: 32.5 pg (ref 26.0–34.0)
MCHC: 33.4 g/dL (ref 30.0–36.0)
MCV: 97.2 fL (ref 78.0–100.0)
Platelets: 233 10*3/uL (ref 150–400)
RBC: 4.28 MIL/uL (ref 4.22–5.81)
RDW: 14.8 % (ref 11.5–15.5)
WBC: 10.9 10*3/uL — AB (ref 4.0–10.5)

## 2016-01-20 LAB — I-STAT TROPONIN, ED: Troponin i, poc: 0 ng/mL (ref 0.00–0.08)

## 2016-01-20 NOTE — ED Notes (Signed)
Pt. reports intermittent central chest pain onset last night , denies SOB , no nausea or diaphoresis . Described pain as " burning " .

## 2016-01-21 ENCOUNTER — Encounter (HOSPITAL_COMMUNITY)
Admission: EM | Disposition: A | Discharge status: Home / Self Care | Payer: Self-pay | Source: Home / Self Care | Attending: Internal Medicine

## 2016-01-21 ENCOUNTER — Encounter (HOSPITAL_COMMUNITY): Payer: Self-pay | Admitting: Student

## 2016-01-21 DIAGNOSIS — I252 Old myocardial infarction: Secondary | ICD-10-CM | POA: Diagnosis present

## 2016-01-21 DIAGNOSIS — R011 Cardiac murmur, unspecified: Secondary | ICD-10-CM | POA: Diagnosis present

## 2016-01-21 DIAGNOSIS — F17200 Nicotine dependence, unspecified, uncomplicated: Secondary | ICD-10-CM | POA: Diagnosis not present

## 2016-01-21 DIAGNOSIS — R7989 Other specified abnormal findings of blood chemistry: Secondary | ICD-10-CM | POA: Diagnosis present

## 2016-01-21 DIAGNOSIS — Z888 Allergy status to other drugs, medicaments and biological substances status: Secondary | ICD-10-CM | POA: Diagnosis not present

## 2016-01-21 DIAGNOSIS — K449 Diaphragmatic hernia without obstruction or gangrene: Secondary | ICD-10-CM | POA: Diagnosis present

## 2016-01-21 DIAGNOSIS — I160 Hypertensive urgency: Secondary | ICD-10-CM

## 2016-01-21 DIAGNOSIS — I209 Angina pectoris, unspecified: Secondary | ICD-10-CM | POA: Diagnosis not present

## 2016-01-21 DIAGNOSIS — I214 Non-ST elevation (NSTEMI) myocardial infarction: Secondary | ICD-10-CM | POA: Diagnosis not present

## 2016-01-21 DIAGNOSIS — I161 Hypertensive emergency: Secondary | ICD-10-CM | POA: Diagnosis not present

## 2016-01-21 DIAGNOSIS — I2510 Atherosclerotic heart disease of native coronary artery without angina pectoris: Secondary | ICD-10-CM | POA: Diagnosis present

## 2016-01-21 DIAGNOSIS — Z7902 Long term (current) use of antithrombotics/antiplatelets: Secondary | ICD-10-CM | POA: Diagnosis not present

## 2016-01-21 DIAGNOSIS — I358 Other nonrheumatic aortic valve disorders: Secondary | ICD-10-CM

## 2016-01-21 DIAGNOSIS — Z87891 Personal history of nicotine dependence: Secondary | ICD-10-CM | POA: Diagnosis present

## 2016-01-21 DIAGNOSIS — E785 Hyperlipidemia, unspecified: Secondary | ICD-10-CM | POA: Diagnosis not present

## 2016-01-21 DIAGNOSIS — Z716 Tobacco abuse counseling: Secondary | ICD-10-CM | POA: Diagnosis not present

## 2016-01-21 DIAGNOSIS — Z8249 Family history of ischemic heart disease and other diseases of the circulatory system: Secondary | ICD-10-CM | POA: Diagnosis not present

## 2016-01-21 DIAGNOSIS — Z803 Family history of malignant neoplasm of breast: Secondary | ICD-10-CM | POA: Diagnosis not present

## 2016-01-21 DIAGNOSIS — R778 Other specified abnormalities of plasma proteins: Secondary | ICD-10-CM | POA: Diagnosis present

## 2016-01-21 DIAGNOSIS — K219 Gastro-esophageal reflux disease without esophagitis: Secondary | ICD-10-CM | POA: Diagnosis present

## 2016-01-21 HISTORY — PX: CARDIAC CATHETERIZATION: SHX172

## 2016-01-21 HISTORY — PX: CORONARY ANGIOPLASTY: SHX604

## 2016-01-21 LAB — PROTIME-INR
INR: 1.07 (ref 0.00–1.49)
PROTHROMBIN TIME: 14.1 s (ref 11.6–15.2)

## 2016-01-21 LAB — COMPREHENSIVE METABOLIC PANEL
ALK PHOS: 76 U/L (ref 38–126)
ALT: 18 U/L (ref 17–63)
AST: 26 U/L (ref 15–41)
Albumin: 3.8 g/dL (ref 3.5–5.0)
Anion gap: 9 (ref 5–15)
BUN: 8 mg/dL (ref 6–20)
CALCIUM: 9.1 mg/dL (ref 8.9–10.3)
CHLORIDE: 106 mmol/L (ref 101–111)
CO2: 23 mmol/L (ref 22–32)
CREATININE: 1.22 mg/dL (ref 0.61–1.24)
Glucose, Bld: 128 mg/dL — ABNORMAL HIGH (ref 65–99)
Potassium: 3.7 mmol/L (ref 3.5–5.1)
SODIUM: 138 mmol/L (ref 135–145)
Total Bilirubin: 0.5 mg/dL (ref 0.3–1.2)
Total Protein: 6.2 g/dL — ABNORMAL LOW (ref 6.5–8.1)

## 2016-01-21 LAB — CBC
HCT: 40.2 % (ref 39.0–52.0)
Hemoglobin: 13.5 g/dL (ref 13.0–17.0)
MCH: 32.4 pg (ref 26.0–34.0)
MCHC: 33.6 g/dL (ref 30.0–36.0)
MCV: 96.4 fL (ref 78.0–100.0)
PLATELETS: 195 10*3/uL (ref 150–400)
RBC: 4.17 MIL/uL — AB (ref 4.22–5.81)
RDW: 14.7 % (ref 11.5–15.5)
WBC: 9.7 10*3/uL (ref 4.0–10.5)

## 2016-01-21 LAB — I-STAT TROPONIN, ED: Troponin i, poc: 0.16 ng/mL (ref 0.00–0.08)

## 2016-01-21 LAB — POCT ACTIVATED CLOTTING TIME: Activated Clotting Time: 446 seconds

## 2016-01-21 LAB — TROPONIN I
TROPONIN I: 0.56 ng/mL — AB (ref <0.031)
TROPONIN I: 1.15 ng/mL — AB (ref <0.031)
Troponin I: 2.91 ng/mL (ref <0.031)
Troponin I: 3.99 ng/mL (ref <0.031)

## 2016-01-21 LAB — LIPID PANEL
Cholesterol: 190 mg/dL (ref 0–200)
HDL: 39 mg/dL — ABNORMAL LOW (ref >40)
LDL CALC: 133 mg/dL — AB (ref 0–99)
TRIGLYCERIDES: 89 mg/dL (ref <150)
Total CHOL/HDL Ratio: 4.9 RATIO
VLDL: 18 mg/dL (ref 0–40)

## 2016-01-21 LAB — TSH: TSH: 0.998 u[IU]/mL (ref 0.350–4.500)

## 2016-01-21 SURGERY — LEFT HEART CATH AND CORONARY ANGIOGRAPHY
Anesthesia: LOCAL

## 2016-01-21 MED ORDER — VERAPAMIL HCL 2.5 MG/ML IV SOLN
INTRAVENOUS | Status: DC | PRN
  Administered 2016-01-21: 10 mL via INTRA_ARTERIAL

## 2016-01-21 MED ORDER — HEPARIN (PORCINE) IN NACL 100-0.45 UNIT/ML-% IJ SOLN
1250.0000 [IU]/h | INTRAMUSCULAR | Status: DC
  Administered 2016-01-21: 1250 [IU]/h via INTRAVENOUS
  Filled 2016-01-21: qty 250

## 2016-01-21 MED ORDER — HEPARIN SODIUM (PORCINE) 1000 UNIT/ML IJ SOLN
INTRAMUSCULAR | Status: AC
  Filled 2016-01-21: qty 1

## 2016-01-21 MED ORDER — HEPARIN BOLUS VIA INFUSION
4000.0000 [IU] | Freq: Once | INTRAVENOUS | Status: AC
  Administered 2016-01-21: 4000 [IU] via INTRAVENOUS
  Filled 2016-01-21: qty 4000

## 2016-01-21 MED ORDER — NITROGLYCERIN 1 MG/10 ML FOR IR/CATH LAB
INTRA_ARTERIAL | Status: AC
  Filled 2016-01-21: qty 10

## 2016-01-21 MED ORDER — MIDAZOLAM HCL 2 MG/2ML IJ SOLN
INTRAMUSCULAR | Status: AC
  Filled 2016-01-21: qty 2

## 2016-01-21 MED ORDER — SODIUM CHLORIDE 0.9% FLUSH: 3.0000 mL | Freq: Two times a day (BID) | INTRAVENOUS | Status: DC

## 2016-01-21 MED ORDER — SODIUM CHLORIDE 0.9 % IV SOLN
250.0000 mg | INTRAVENOUS | Status: DC | PRN
  Administered 2016-01-21: 1.75 mg/kg/h via INTRAVENOUS

## 2016-01-21 MED ORDER — TICAGRELOR 90 MG PO TABS
ORAL_TABLET | ORAL | Status: AC
  Filled 2016-01-21: qty 1

## 2016-01-21 MED ORDER — SODIUM CHLORIDE 0.9% FLUSH: 3.0000 mL | INTRAVENOUS | Status: DC | PRN

## 2016-01-21 MED ORDER — MIDAZOLAM HCL 2 MG/2ML IJ SOLN
INTRAMUSCULAR | Status: DC | PRN
  Administered 2016-01-21: 2 mg via INTRAVENOUS

## 2016-01-21 MED ORDER — CLOPIDOGREL BISULFATE 75 MG PO TABS
150.0000 mg | ORAL_TABLET | Freq: Once | ORAL | Status: AC
  Administered 2016-01-21: 150 mg via ORAL
  Filled 2016-01-21: qty 2

## 2016-01-21 MED ORDER — FENTANYL CITRATE (PF) 100 MCG/2ML IJ SOLN
INTRAMUSCULAR | Status: AC
  Filled 2016-01-21: qty 2

## 2016-01-21 MED ORDER — ATORVASTATIN CALCIUM 80 MG PO TABS
80.0000 mg | ORAL_TABLET | Freq: Every day | ORAL | Status: DC
  Administered 2016-01-21: 80 mg via ORAL
  Filled 2016-01-21: qty 1

## 2016-01-21 MED ORDER — SODIUM CHLORIDE 0.9 % WEIGHT BASED INFUSION
3.0000 mL/kg/h | INTRAVENOUS | Status: AC
  Administered 2016-01-21 (×2): 3 mL/kg/h via INTRAVENOUS

## 2016-01-21 MED ORDER — BIVALIRUDIN 250 MG IV SOLR
INTRAVENOUS | Status: AC
  Filled 2016-01-21: qty 250

## 2016-01-21 MED ORDER — SODIUM CHLORIDE 0.9 % WEIGHT BASED INFUSION
1.0000 mL/kg/h | INTRAVENOUS | Status: DC
  Administered 2016-01-21 (×2): 1 mL/kg/h via INTRAVENOUS

## 2016-01-21 MED ORDER — TICAGRELOR 90 MG PO TABS
90.0000 mg | ORAL_TABLET | Freq: Two times a day (BID) | ORAL | Status: DC
  Administered 2016-01-22 (×2): 90 mg via ORAL
  Filled 2016-01-21 (×2): qty 1

## 2016-01-21 MED ORDER — ACETAMINOPHEN 325 MG PO TABS: 650.0000 mg | ORAL_TABLET | Freq: Four times a day (QID) | ORAL | Status: DC | PRN

## 2016-01-21 MED ORDER — NITROGLYCERIN 1 MG/10 ML FOR IR/CATH LAB
INTRA_ARTERIAL | Status: DC | PRN
  Administered 2016-01-21 (×2): 200 ug via INTRA_ARTERIAL

## 2016-01-21 MED ORDER — SODIUM CHLORIDE 0.9 % IV SOLN: 250.0000 mL | INTRAVENOUS | Status: DC | PRN

## 2016-01-21 MED ORDER — GI COCKTAIL ~~LOC~~
30.0000 mL | Freq: Once | ORAL | Status: AC
  Administered 2016-01-21: 30 mL via ORAL
  Filled 2016-01-21: qty 30

## 2016-01-21 MED ORDER — HEPARIN (PORCINE) IN NACL 2-0.9 UNIT/ML-% IJ SOLN
INTRAMUSCULAR | Status: AC
  Filled 2016-01-21: qty 1000

## 2016-01-21 MED ORDER — IOPAMIDOL (ISOVUE-370) INJECTION 76%
INTRAVENOUS | Status: AC
  Filled 2016-01-21: qty 100

## 2016-01-21 MED ORDER — FENTANYL CITRATE (PF) 100 MCG/2ML IJ SOLN
INTRAMUSCULAR | Status: DC | PRN
  Administered 2016-01-21: 50 ug via INTRAVENOUS

## 2016-01-21 MED ORDER — LABETALOL HCL 5 MG/ML IV SOLN
20.0000 mg | Freq: Once | INTRAVENOUS | Status: AC
Start: 2016-01-21 — End: 2016-01-21
  Administered 2016-01-21: 20 mg via INTRAVENOUS
  Filled 2016-01-21: qty 4

## 2016-01-21 MED ORDER — VERAPAMIL HCL 2.5 MG/ML IV SOLN
INTRAVENOUS | Status: AC
  Filled 2016-01-21: qty 2

## 2016-01-21 MED ORDER — LIDOCAINE HCL (PF) 1 % IJ SOLN
INTRAMUSCULAR | Status: DC | PRN
  Administered 2016-01-21: 3 mL via INTRADERMAL

## 2016-01-21 MED ORDER — LIDOCAINE HCL (PF) 1 % IJ SOLN
INTRAMUSCULAR | Status: AC
  Filled 2016-01-21: qty 30

## 2016-01-21 MED ORDER — NITROGLYCERIN 0.4 MG SL SUBL
0.4000 mg | SUBLINGUAL_TABLET | SUBLINGUAL | Status: DC | PRN
  Administered 2016-01-21 (×2): 0.4 mg via SUBLINGUAL
  Filled 2016-01-21: qty 1

## 2016-01-21 MED ORDER — ASPIRIN 81 MG PO CHEW: 81.0000 mg | CHEWABLE_TABLET | ORAL | Status: DC

## 2016-01-21 MED ORDER — SODIUM CHLORIDE 0.9 % WEIGHT BASED INFUSION
3.0000 mL/kg/h | INTRAVENOUS | Status: DC
  Administered 2016-01-21: 3 mL/kg/h via INTRAVENOUS

## 2016-01-21 MED ORDER — HEPARIN (PORCINE) IN NACL 2-0.9 UNIT/ML-% IJ SOLN
INTRAMUSCULAR | Status: DC | PRN
  Administered 2016-01-21: 1000 mL via INTRA_ARTERIAL

## 2016-01-21 MED ORDER — HEPARIN SODIUM (PORCINE) 1000 UNIT/ML IJ SOLN
INTRAMUSCULAR | Status: DC | PRN
  Administered 2016-01-21: 4500 [IU] via INTRAVENOUS

## 2016-01-21 MED ORDER — NITROGLYCERIN IN D5W 200-5 MCG/ML-% IV SOLN
2.0000 ug/min | INTRAVENOUS | Status: DC
  Administered 2016-01-21: 30 ug/min via INTRAVENOUS
  Administered 2016-01-21: 20 ug/min via INTRAVENOUS
  Administered 2016-01-21: 5 ug/min via INTRAVENOUS
  Administered 2016-01-21: 10 ug/min via INTRAVENOUS
  Administered 2016-01-21: 5 ug/min via INTRAVENOUS
  Administered 2016-01-21: 25 ug/min via INTRAVENOUS
  Filled 2016-01-21: qty 250

## 2016-01-21 MED ORDER — TICAGRELOR 90 MG PO TABS
ORAL_TABLET | ORAL | Status: DC | PRN
  Administered 2016-01-21: 180 mg via ORAL

## 2016-01-21 MED ORDER — CLONIDINE HCL 0.2 MG PO TABS
0.2000 mg | ORAL_TABLET | Freq: Once | ORAL | Status: AC
  Administered 2016-01-21: 0.2 mg via ORAL
  Filled 2016-01-21: qty 1

## 2016-01-21 MED ORDER — ASPIRIN EC 81 MG PO TBEC
81.0000 mg | DELAYED_RELEASE_TABLET | Freq: Every day | ORAL | Status: DC
  Administered 2016-01-21 – 2016-01-22 (×2): 81 mg via ORAL
  Filled 2016-01-21 (×2): qty 1

## 2016-01-21 MED ORDER — BIVALIRUDIN BOLUS VIA INFUSION - CUPID
INTRAVENOUS | Status: DC | PRN
  Administered 2016-01-21: 66.825 mg via INTRAVENOUS

## 2016-01-21 MED ORDER — PANTOPRAZOLE SODIUM 40 MG IV SOLR
40.0000 mg | Freq: Two times a day (BID) | INTRAVENOUS | Status: DC
  Administered 2016-01-21 – 2016-01-22 (×3): 40 mg via INTRAVENOUS
  Filled 2016-01-21 (×4): qty 40

## 2016-01-21 MED ORDER — METOPROLOL TARTRATE 12.5 MG HALF TABLET
12.5000 mg | ORAL_TABLET | Freq: Two times a day (BID) | ORAL | Status: DC
  Administered 2016-01-21: 22:00:00 12.5 mg via ORAL
  Filled 2016-01-21: qty 1

## 2016-01-21 MED ORDER — MORPHINE SULFATE (PF) 4 MG/ML IV SOLN
4.0000 mg | Freq: Once | INTRAVENOUS | Status: AC
  Administered 2016-01-21: 4 mg via INTRAVENOUS
  Filled 2016-01-21: qty 1

## 2016-01-21 SURGICAL SUPPLY — 19 items
BALLN EUPHORA RX 2.0X12 (BALLOONS) ×2
BALLN EUPHORA RX 2.0X20 (BALLOONS) ×2
BALLOON EUPHORA RX 2.0X12 (BALLOONS) IMPLANT
BALLOON EUPHORA RX 2.0X20 (BALLOONS) IMPLANT
CATH INFINITI 5FR ANG PIGTAIL (CATHETERS) ×1 IMPLANT
CATH INFINITI JR4 5F (CATHETERS) ×1 IMPLANT
CATH LAUNCHER 5F RADR (CATHETERS) IMPLANT
CATH OPTITORQUE TIG 4.0 5F (CATHETERS) ×1 IMPLANT
CATH VISTA GUIDE 6FR XBLAD3.5 (CATHETERS) ×1 IMPLANT
CATHETER LAUNCHER 5F RADR (CATHETERS) ×2
DEVICE RAD COMP TR BAND LRG (VASCULAR PRODUCTS) ×1 IMPLANT
GLIDESHEATH SLEND A-KIT 6F 22G (SHEATH) ×1 IMPLANT
KIT ENCORE 26 ADVANTAGE (KITS) ×1 IMPLANT
KIT HEART LEFT (KITS) ×2 IMPLANT
PACK CARDIAC CATHETERIZATION (CUSTOM PROCEDURE TRAY) ×2 IMPLANT
TRANSDUCER W/STOPCOCK (MISCELLANEOUS) ×2 IMPLANT
TUBING CIL FLEX 10 FLL-RA (TUBING) ×2 IMPLANT
WIRE INTUITION HYDRO ST. 180CM (WIRE) ×1 IMPLANT
WIRE SAFE-T 1.5MM-J .035X260CM (WIRE) ×1 IMPLANT

## 2016-01-21 NOTE — Progress Notes (Signed)
Hospital Problem List     Principal Problem:   NSTEMI (non-ST elevated myocardial infarction) (HCC) Active Problems:   DDD (degenerative disc disease), lumbar   Tobacco user   Hypertensive urgency   Ischemic chest pain (HCC)   Tobacco dependence   Systolic murmur of aorta    Patient Profile:   Primary Cardiologist: New  65 yo male w/ PMH of tobacco abuse and no prior history of CAD who [facility] this [facility] for evaluation of chest pain. Initial troponin elevated to 0.56.   Subjective   Reports chest pain resolved after taking Protonix and being started on NTG Drip. Denies any associated dyspnea, diaphoresis, nausea, or vomiting.  Inpatient Medications    . aspirin EC  81 mg Oral Daily  . atorvastatin  80 mg Oral q1800  . metoprolol tartrate  12.5 mg Oral BID  . pantoprazole (PROTONIX) IV  40 mg Intravenous Q12H  . sodium chloride flush  3 mL Intravenous Q12H    Vital Signs    Filed Vitals:   01/21/16 0515 01/21/16 0546 01/21/16 0741 01/21/16 0932  BP: 158/90 186/67 127/74 137/83  Pulse: 67 61 74 70  Temp:  98.2 F (36.8 C)    TempSrc:  Oral    Resp: 15 16    Height:  5\' 10"  (1.778 m)    Weight:  196 lb 6.9 oz (89.1 kg)    SpO2: 99% 98%     No intake or output data in the 24 hours ending 01/21/16 1043 Filed Weights   01/20/16 2152 01/21/16 0546  Weight: 198 lb (89.812 kg) 196 lb 6.9 oz (89.1 kg)    Physical Exam    General: Well developed, well nourished, male appearing in no acute distress. Head: Normocephalic, atraumatic.  Neck: Supple without bruits, JVD not elevated. Lungs:  Resp regular and unlabored, CTA without wheezing or rales. Heart: RRR, S1, S2, no S3, S4, 2/6 SEM at RUSB; no rub. Abdomen: Soft, non-tender, non-distended with normoactive bowel sounds. No hepatomegaly. No rebound/guarding. No obvious abdominal masses. Extremities: No clubbing, cyanosis, or edema. Distal pedal pulses are 2+ bilaterally. Neuro: Alert and  oriented X 3. Moves all extremities spontaneously. Psych: Normal affect.  Labs    CBC  Recent Labs  01/20/16 2212 01/21/16 0617  WBC 10.9* 9.7  HGB 13.9 13.5  HCT 41.6 40.2  MCV 97.2 96.4  PLT 233 195   Basic Metabolic Panel  Recent Labs  01/20/16 2212 01/21/16 0617  NA 139 138  K 3.7 3.7  CL 103 106  CO2 24 23  GLUCOSE 147* 128*  BUN 8 8  CREATININE 1.27* 1.22  CALCIUM 9.3 9.1   Liver Function Tests  Recent Labs  01/21/16 0617  AST 26  ALT 18  ALKPHOS 76  BILITOT 0.5  PROT 6.2*  ALBUMIN 3.8   No results for input(s): LIPASE, AMYLASE in the last 72 hours. Cardiac Enzymes  Recent Labs  01/21/16 0245 01/21/16 0617  TROPONINI 0.56* 1.15*   BNP Invalid input(s): POCBNP D-Dimer No results for input(s): DDIMER in the last 72 hours. Hemoglobin A1C No results for input(s): HGBA1C in the last 72 hours. Fasting Lipid Panel  Recent Labs  01/21/16 0617  CHOL 190  HDL 39*  LDLCALC 133*  TRIG 89  CHOLHDL 4.9   Thyroid Function Tests  Recent Labs  01/21/16 0617  TSH 0.998    Telemetry    NSR, HR in 60's - 70's. No atopic events.  ECG  SR, HR 82 with nonspecific lateral ST abnormality.   Cardiac Studies and Radiology    Dg Chest 2 View: 01/20/2016  CLINICAL DATA:  Left-sided chest pain since last night. EXAM: CHEST  2 VIEW COMPARISON:  None. FINDINGS: The heart is normal in size, mild tortuosity of the thoracic aorta. Pulmonary vasculature is normal. No consolidation, pleural effusion, or pneumothorax. No acute osseous abnormalities are seen. Degenerative change in the thoracic spine. IMPRESSION: No acute pulmonary process. Electronically Signed   By: Rubye Oaks M.D.   On: 01/20/2016 23:43   Echocardiogram: Pending  Assessment & Plan    1. NSTEMI - presented with a burning sensation in his chest. Reports it was similar to previous episodes of acid reflux but did not resolve after taking Zantac. Denies any associated symptoms. Pain  resolved after being placed on NTG drip. - Cyclic troponin values have been 0.56 and 1.15 thus far.  - Discussed with Dr. Excell Seltzer, will plan for a cardiac catheterization later today.  - continue ASA, Heparin, statin, and NTG drip. Will initiate low-dose BB.   2. Hypertensive Emergency - BP initially elevated at 208/114. Improved to 137/83 following administration of Labetalol and being started on IV NTG. - not on any anti-hypertensive medication prior to admission. - start low-dose BB.   3. Tobacco Abuse - smoking cessation advised.  4. HLD - LDL elevated to 133. - started on high-dose statin.  Lorri Frederick , PA-C 10:43 AM 01/21/2016 Pager: [phone]  Patient seen, examined. Available data reviewed. Agree with findings, assessment, and plan as outlined by Randall An, PA-C.  The patient is independently interviewed and examined. Lungs are clear. Heart is regular rate and rhythm without murmur or gallop. Abdomen is soft and nontender. Extremities are without edema and peripheral pulses are intact and equal. The patient presents with hypertensive emergency and non-ST elevation infarction. Describes pain as "indigestion." He has a high-risk profile with positive enzymes, long-standing tobacco use, hypertension, and hyperlipidemia. I think cardiac catheterization and possible PCI are clearly indicated. I have reviewed the risks, indications, and alternatives to cardiac catheterization, possible angioplasty, and stenting with the patient. Risks include but are not limited to bleeding, infection, vascular injury, stroke, myocardial infection, arrhythmia, kidney injury, radiation-related injury in the case of prolonged fluoroscopy use, emergency cardiac surgery, and death. The patient understands the risks of serious complication is 1-2 in 1000 with diagnostic cardiac cath and 1-2% or less with angioplasty/stenting. Will start the patient on IV heparin until cath is  done.  Tonny Bollman, M.D. 01/21/2016 10:50 AM

## 2016-01-21 NOTE — ED Provider Notes (Signed)
CSN: [id number]     Arrival date & time 01/20/16  2142 History  By signing my name below, I, Bethel BornBritney McCollum, attest that this documentation has been prepared under the direction and in the presence of Azalia BilisKevin Chilton Sallade, MD. Electronically Signed: Bethel BornBritney McCollum, ED Scribe. 01/21/2016. 3:39 AM   Chief Complaint  Patient presents with  . Chest Pain   The history is provided by the patient. No language interpreter was used.   Ferdinand CavaJames A Wilz is a 65 y.o. male with PMHx of hiatal hernia who presents to the Emergency Department complaining of intermittent, burning, central chest pain with onset last night. The pain feels like heartburn but the Zantac that he typically uses provided insufficient pain relief at home. The pain is not modified by position or eating. He denies any pain radiation to the neck, back, or arms. Associated symptoms include belching.  Pt denies fever, chills, sweating, SOB, n/v/d, abdominal pain, and LE swelling. No history of DM, HTN, high cholesterol, or CAD. He has no known family history of early heart disease. He is a smoker and has been for more than 30 years.   Past Medical History  Diagnosis Date  . Allergy   . Arthritis   . Blood transfusion without reported diagnosis    Past Surgical History  Procedure Laterality Date  . Spine surgery    . Back surgery     Family History  Problem Relation Age of Onset  . Cancer Mother     breast  . Heart disease Father     heart attack  . Cancer Sister     breast  . Heart disease Brother     heart attack  . Diabetes Sister    Social History  Substance Use Topics  . Smoking status: Current Every Day Smoker  . Smokeless tobacco: None  . Alcohol Use: No    Review of Systems  10 Systems reviewed and all are negative for acute change except as noted in the HPI.  Allergies  Asa  Home Medications   Prior to Admission medications   Medication Sig Start Date End Date Taking? Authorizing Provider  acetaminophen  (TYLENOL) 500 MG tablet Take 1 tablet (500 mg total) by mouth every 6 (six) hours as needed. Patient taking differently: Take 500 mg by mouth every 6 (six) hours as needed for mild pain or fever.  01/08/15  Yes Ofilia NeasMichael L Clark, PA-C  ibuprofen (ADVIL,MOTRIN) 400 MG tablet Take 1 tablet (400 mg total) by mouth every 8 (eight) hours as needed. Patient taking differently: Take 400 mg by mouth every 8 (eight) hours as needed for moderate pain.  01/08/15  Yes Ofilia NeasMichael L Clark, PA-C  OVER THE COUNTER MEDICATION Take 1,000 mg by mouth daily. Glucosamine 1000 mg taking   Yes Historical Provider, MD  OVER THE COUNTER MEDICATION Take 900 mg by mouth daily. Saw Palmetto 900 mg taking   Yes Historical Provider, MD  OVER THE COUNTER MEDICATION Take 100 mg by mouth daily. Garlic Tablets 409100 mg taking   Yes Historical Provider, MD  ranitidine (ZANTAC) 150 MG tablet Take 150 mg by mouth 2 (two) times daily.   Yes Historical Provider, MD   BP 161/108 mmHg  Pulse 94  Temp(Src) 97.9 F (36.6 C) (Oral)  Resp 20  Ht 5\' 10"  (1.778 m)  Wt 198 lb (89.812 kg)  BMI 28.41 kg/m2  SpO2 98% Physical Exam  Constitutional: He is oriented to person, place, and time. He appears well-developed and well-nourished.  HENT:  Head: Normocephalic and atraumatic.  Eyes: EOM are normal.  Neck: Normal range of motion.  Cardiovascular: Normal rate, regular rhythm, normal heart sounds and intact distal pulses.   Pulmonary/Chest: Effort normal and breath sounds normal. No respiratory distress.  Abdominal: Soft. He exhibits no distension. There is no tenderness.  Musculoskeletal: Normal range of motion.  Neurological: He is alert and oriented to person, place, and time.  Skin: Skin is warm and dry.  Psychiatric: He has a normal mood and affect. Judgment normal.  Nursing note and vitals reviewed.   ED Course  Procedures (including critical care time) DIAGNOSTIC STUDIES: Oxygen Saturation is 98% on RA,  normal by my interpretation.     COORDINATION OF CARE: 12:18 AM Discussed treatment plan which includes lab work, CXR, EKG, GI cocktail, and clonidine with pt at bedside and pt agreed to plan.  3:34 AM-Consult complete with Dr. Virgina Organ (Cardiology). Patient case explained and discussed. Call ended at 3:38 AM   Labs Review Labs Reviewed  BASIC METABOLIC PANEL - Abnormal; Notable for the following:    Glucose, Bld 147 (*)    Creatinine, Ser 1.27 (*)    GFR calc non Af Amer 58 (*)    All other components within normal limits  CBC - Abnormal; Notable for the following:    WBC 10.9 (*)    All other components within normal limits  I-STAT TROPOININ, ED - Abnormal; Notable for the following:    Troponin i, poc 0.16 (*)    All other components within normal limits  TROPONIN I  I-STAT TROPOININ, ED    Imaging Review Dg Chest 2 View  01/20/2016  CLINICAL DATA:  Left-sided chest pain since last night. EXAM: CHEST  2 VIEW COMPARISON:  None. FINDINGS: The heart is normal in size, mild tortuosity of the thoracic aorta. Pulmonary vasculature is normal. No consolidation, pleural effusion, or pneumothorax. No acute osseous abnormalities are seen. Degenerative change in the thoracic spine. IMPRESSION: No acute pulmonary process. Electronically Signed   By: Rubye Oaks M.D.   On: 01/20/2016 23:43   I have personally reviewed and evaluated these images and lab results as part of my medical decision-making.  EKG Interpretation #1 Date/Time:  Sunday January 20 2016 21:49:21 EDT Ventricular Rate:  85 PR Interval:  146 QRS Duration: 82 QT Interval:  360 QTC Calculation: 428 R Axis:   14 Text Interpretation:  Normal sinus rhythm Possible Left atrial enlargement  Borderline ECG No significant change was found Confirmed by Mckensie Scotti  MD,  Ellaina Schuler ([id number]) on 01/21/2016 12:10:58 AM       ECG interpretation #2 ([id number])   Date: 01/21/2016  Rate: 69  Rhythm: normal sinus rhythm  QRS Axis: normal  Intervals: normal  ST/T Wave  abnormalities: normal  Conduction Disutrbances: none  Narrative Interpretation:   Old EKG Reviewed: No significant changes noted   ECG interpretation #3 ([id number])   Date: 01/21/2016  Rate: 83  Rhythm: normal sinus rhythm  QRS Axis: normal  Intervals: normal  ST/T Wave abnormalities: normal  Conduction Disutrbances: none  Narrative Interpretation:   Old EKG Reviewed: No significant changes noted     MDM   Final diagnoses:  Chest pain, unspecified chest pain type  Elevated troponin  Hypertensive urgency   3:39 AM Patient with ongoing chest discomfort this time.  EKG 3 without ischemic changes.  He is hypertensive here in the emergency department with blood pressures in the 190s over 112 range.  His last blood pressure was  203/104.  Some of this may represent hypertensive urgency.  More aggressive blood pressure control this time.  Cardiology consultation given ongoing chest discomfort and second troponin being elevated.  He'll likely need cardiac enzymes cycled and serial EKGs.  I personally performed the services described in this documentation, which was scribed in my presence. The recorded information has been reviewed and is accurate.       Azalia Bilis, MD 01/21/16 [phone]

## 2016-01-21 NOTE — H&P (View-Only) (Signed)
  Hospital Problem List     Principal Problem:   NSTEMI (non-ST elevated myocardial infarction) (HCC) Active Problems:   DDD (degenerative disc disease), lumbar   Tobacco user   Hypertensive urgency   Ischemic chest pain (HCC)   Tobacco dependence   Systolic murmur of aorta    Patient Profile:   Primary Cardiologist: New  64 yo male w/ PMH of tobacco abuse and no prior history of CAD who presented to [facility] earlier this morning for evaluation of chest pain. Initial troponin elevated to 0.56.   Subjective   Reports chest pain resolved after taking Protonix and being started on NTG Drip. Denies any associated dyspnea, diaphoresis, nausea, or vomiting.  Inpatient Medications    . aspirin EC  81 mg Oral Daily  . atorvastatin  80 mg Oral q1800  . metoprolol tartrate  12.5 mg Oral BID  . pantoprazole (PROTONIX) IV  40 mg Intravenous Q12H  . sodium chloride flush  3 mL Intravenous Q12H    Vital Signs    Filed Vitals:   01/21/16 0515 01/21/16 0546 01/21/16 0741 01/21/16 0932  BP: 158/90 186/67 127/74 137/83  Pulse: 67 61 74 70  Temp:  98.2 F (36.8 C)    TempSrc:  Oral    Resp: 15 16    Height:  5' 10" (1.778 m)    Weight:  196 lb 6.9 oz (89.1 kg)    SpO2: 99% 98%     No intake or output data in the 24 hours ending 01/21/16 1043 Filed Weights   01/20/16 2152 01/21/16 0546  Weight: 198 lb (89.812 kg) 196 lb 6.9 oz (89.1 kg)    Physical Exam    General: Well developed, well nourished, male appearing in no acute distress. Head: Normocephalic, atraumatic.  Neck: Supple without bruits, JVD not elevated. Lungs:  Resp regular and unlabored, CTA without wheezing or rales. Heart: RRR, S1, S2, no S3, S4, 2/6 SEM at RUSB; no rub. Abdomen: Soft, non-tender, non-distended with normoactive bowel sounds. No hepatomegaly. No rebound/guarding. No obvious abdominal masses. Extremities: No clubbing, cyanosis, or edema. Distal pedal pulses are 2+ bilaterally. Neuro: Alert and  oriented X 3. Moves all extremities spontaneously. Psych: Normal affect.  Labs    CBC  Recent Labs  01/20/16 2212 01/21/16 0617  WBC 10.9* 9.7  HGB 13.9 13.5  HCT 41.6 40.2  MCV 97.2 96.4  PLT 233 195   Basic Metabolic Panel  Recent Labs  01/20/16 2212 01/21/16 0617  NA 139 138  K 3.7 3.7  CL 103 106  CO2 24 23  GLUCOSE 147* 128*  BUN 8 8  CREATININE 1.27* 1.22  CALCIUM 9.3 9.1   Liver Function Tests  Recent Labs  01/21/16 0617  AST 26  ALT 18  ALKPHOS 76  BILITOT 0.5  PROT 6.2*  ALBUMIN 3.8   No results for input(s): LIPASE, AMYLASE in the last 72 hours. Cardiac Enzymes  Recent Labs  01/21/16 0245 01/21/16 0617  TROPONINI 0.56* 1.15*   BNP Invalid input(s): POCBNP D-Dimer No results for input(s): DDIMER in the last 72 hours. Hemoglobin A1C No results for input(s): HGBA1C in the last 72 hours. Fasting Lipid Panel  Recent Labs  01/21/16 0617  CHOL 190  HDL 39*  LDLCALC 133*  TRIG 89  CHOLHDL 4.9   Thyroid Function Tests  Recent Labs  01/21/16 0617  TSH 0.998    Telemetry    NSR, HR in 60's - 70's. No atopic events.  ECG      SR, HR 82 with nonspecific lateral ST abnormality.   Cardiac Studies and Radiology    Dg Chest 2 View: 01/20/2016  CLINICAL DATA:  Left-sided chest pain since last night. EXAM: CHEST  2 VIEW COMPARISON:  None. FINDINGS: The heart is normal in size, mild tortuosity of the thoracic aorta. Pulmonary vasculature is normal. No consolidation, pleural effusion, or pneumothorax. No acute osseous abnormalities are seen. Degenerative change in the thoracic spine. IMPRESSION: No acute pulmonary process. Electronically Signed   By: Melanie  Ehinger M.D.   On: 01/20/2016 23:43   Echocardiogram: Pending  Assessment & Plan    1. NSTEMI - presented with a burning sensation in his chest. Reports it was similar to previous episodes of acid reflux but did not resolve after taking Zantac. Denies any associated symptoms. Pain  resolved after being placed on NTG drip. - Cyclic troponin values have been 0.56 and 1.15 thus far.  - Discussed with Dr. Manali Mcelmurry, will plan for a cardiac catheterization later today.  - continue ASA, Heparin, statin, and NTG drip. Will initiate low-dose BB.   2. Hypertensive Emergency - BP initially elevated at 208/114. Improved to 137/83 following administration of Labetalol and being started on IV NTG. - not on any anti-hypertensive medication prior to admission. - start low-dose BB.   3. Tobacco Abuse - smoking cessation advised.  4. HLD - LDL elevated to 133. - started on high-dose statin.  Signed, Brittany M Strader , PA-C 10:43 AM 01/21/2016 Pager: [phone]  Patient seen, examined. Available data reviewed. Agree with findings, assessment, and plan as outlined by Brittany Strader, PA-C.  The patient is independently interviewed and examined. Lungs are clear. Heart is regular rate and rhythm without murmur or gallop. Abdomen is soft and nontender. Extremities are without edema and peripheral pulses are intact and equal. The patient presents with hypertensive emergency and non-ST elevation infarction. Describes pain as "indigestion." He has a high-risk profile with positive enzymes, long-standing tobacco use, hypertension, and hyperlipidemia. I think cardiac catheterization and possible PCI are clearly indicated. I have reviewed the risks, indications, and alternatives to cardiac catheterization, possible angioplasty, and stenting with the patient. Risks include but are not limited to bleeding, infection, vascular injury, stroke, myocardial infection, arrhythmia, kidney injury, radiation-related injury in the case of prolonged fluoroscopy use, emergency cardiac surgery, and death. The patient understands the risks of serious complication is 1-2 in 1000 with diagnostic cardiac cath and 1-2% or less with angioplasty/stenting. Will start the patient on IV heparin until cath is  done.  Thai Hemrick, M.D. 01/21/2016 10:50 AM   

## 2016-01-21 NOTE — Progress Notes (Signed)
Utilization review completed.  

## 2016-01-21 NOTE — ED Notes (Signed)
Attempted report 

## 2016-01-21 NOTE — Care Management Note (Signed)
Case Management Note Donn PieriniKristi Jadarrius Maselli RN, BSN Unit 2W-Case Manager [phone]  Patient Details  Name: Billy CavaJames A Bourque MRN: [id number] Date of Birth: [birth year]  Subjective/Objective:     Pt admitted with NSTEMI               Action/Plan: PTA pt lived at home- independent- anticipate return home- CM to follow for potential d/c needs  Expected Discharge Date:                  Expected Discharge Plan:  Home/Self Care  In-House Referral:     Discharge planning Services  CM Consult  Post Acute Care Choice:    Choice offered to:     DME Arranged:    DME Agency:     HH Arranged:    HH Agency:     Status of Service:  In process, will continue to follow  Medicare Important Message Given:    Date Medicare IM Given:    Medicare IM give by:    Date Additional Medicare IM Given:    Additional Medicare Important Message give by:     If discussed at Long Length of Stay Meetings, dates discussed:    Additional Comments:  Darrold SpanWebster, Refugio Mcconico Hall, RN 01/21/2016, 11:20 AM

## 2016-01-21 NOTE — Progress Notes (Addendum)
   01/21/16 0546  Vitals  Temp 98.2 F (36.8 C)  Temp Source Oral  BP (!) 186/167 mmHg  MAP (mmHg) 171  BP Location Right Arm  BP Method Automatic  Patient Position (if appropriate) Sitting  Pulse Rate 61  Pulse Rate Source Dinamap  Resp 16  Oxygen Therapy  SpO2 98 %  O2 Device Room Air  Pain Assessment  Pain Assessment No/denies pain  Height and Weight  Height 5\' 10"  (1.778 m)  Weight 89.1 kg (196 lb 6.9 oz)  BSA (Calculated - sq m) 2.1 sq meters  BMI (Calculated) 28.2  Weight in (lb) to have BMI = 25 173.9    Patient arrived from the ED. Patient oriented to the unit, room and call bell. Denies any pain. CCMD called and verified. BP will be recheck and  will continue to monitor closely.  BP 6AM correction: 186/67 BP 745AM recheck: 127/74

## 2016-01-21 NOTE — Interval H&P Note (Signed)
History and Physical Interval Note:  01/21/2016 3:25 PM  Billy CavaJames A Riley  has presented today for surgery, with the diagnosis of non stemi  The various methods of treatment have been discussed with the patient and family. After consideration of risks, benefits and other options for treatment, the patient has consented to  Procedure(s): Left Heart Cath and Coronary Angiography (N/A) as a surgical intervention .  The patient's history has been reviewed, patient examined, no change in status, stable for surgery.  I have reviewed the patient's chart and labs.  Questions were answered to the patient's satisfaction.     Cath Lab Visit (complete for each Cath Lab visit)  Clinical Evaluation Leading to the Procedure:   ACS: Yes.    Non-ACS:    Anginal Classification: CCS IV  Anti-ischemic medical therapy: Minimal Therapy (1 class of medications)  Non-Invasive Test Results: No non-invasive testing performed  Prior CABG: No previous CABG  TIMI Score  Patient Information:  TIMI Score is 3   UA/NSTEMI and intermediate-risk features (e.g., TIMI score 3-4) for short-term risk of death or nonfatal MI  Revascularization of the presumed culprit artery   A (9)  Indication: 10; Score: 9     Billy Riley W

## 2016-01-21 NOTE — H&P (Addendum)
CARDIOLOGY INPATIENT HISTORY AND PHYSICAL EXAMINATION NOTE  Patient ID: Billy Riley MRN: [id number], DOB/AGE: 1951-03-22   Admit date: 01/20/2016   Primary Physician: No primary care provider on file. Primary Cardiologist: none  Reason for admission: chest pain  HPI: This is a 65 y.o.black male without known history of CAD and risk factors (hypertension, tobacco abuse), hiatal hernia who presented with chest pain. Patient was in his usual state of health until yesterday when he started having chest pains. The pain came on gradually, burning in quality, 10/10 in intensity, it lasted for 7 hours. It improved with NTG. He started having this pain after the lunch when he had cabbage and dumplings. He took zantac thinking the chest pain is due to hiatal hernia. He has been having a lot of burping and it helps with the pain. He has been having acid reflux like pain for the last 2 years. He had a barium swallow almost 9 years ago when he was diagnosed with hiatal hernia.    Problem List: Past Medical History  Diagnosis Date  . Allergy   . Arthritis   . Blood transfusion without reported diagnosis     Past Surgical History  Procedure Laterality Date  . Spine surgery    . Back surgery       Allergies:  Allergies  Allergen Reactions  . Asa [Aspirin]     Burns stomach     Home Medications Current Facility-Administered Medications  Medication Dose Route Frequency Provider Last Rate Last Dose  . clopidogrel (PLAVIX) tablet 150 mg  150 mg Oral Once Azalia Bilis, MD      . nitroGLYCERIN (NITROSTAT) SL tablet 0.4 mg  0.4 mg Sublingual Q5 min PRN Azalia Bilis, MD       Current Outpatient Prescriptions  Medication Sig Dispense Refill  . acetaminophen (TYLENOL) 500 MG tablet Take 1 tablet (500 mg total) by mouth every 6 (six) hours as needed. (Patient taking differently: Take 500 mg by mouth every 6 (six) hours as needed for mild pain or fever. ) 30 tablet 0  . ibuprofen  (ADVIL,MOTRIN) 400 MG tablet Take 1 tablet (400 mg total) by mouth every 8 (eight) hours as needed. (Patient taking differently: Take 400 mg by mouth every 8 (eight) hours as needed for moderate pain. ) 30 tablet 0  . OVER THE COUNTER MEDICATION Take 1,000 mg by mouth daily. Glucosamine 1000 mg taking    . OVER THE COUNTER MEDICATION Take 900 mg by mouth daily. Saw Palmetto 900 mg taking    . OVER THE COUNTER MEDICATION Take 100 mg by mouth daily. Garlic Tablets 409 mg taking    . ranitidine (ZANTAC) 150 MG tablet Take 150 mg by mouth 2 (two) times daily.       Family History  Problem Relation Age of Onset  . Cancer Mother     breast  . Heart disease Father     heart attack  . Cancer Sister     breast  . Heart disease Brother     heart attack  . Diabetes Sister      Social History   Social History  . Marital Status: Married    Spouse Name: N/A  . Number of Children: N/A  . Years of Education: N/A   Occupational History  . Not on file.   Social History Main Topics  . Smoking status: Current Every Day Smoker  . Smokeless tobacco: Not on file  . Alcohol Use: No  . Drug  Use: No  . Sexual Activity: Yes   Other Topics Concern  . Not on file   Social History Narrative     Review of Systems: General: negative for chills, fever, night sweats or weight changes.  Cardiovascular: chest pain negative for dyspnea on exertion, edema, orthopnea, palpitations, paroxysmal nocturnal dyspnea or shortness of breath  Dermatological: negative for rash Respiratory: negative for cough or wheezing Urologic: negative for hematuria Abdominal: negative for nausea, vomiting, diarrhea, bright red blood per rectum, melena, or hematemesis Neurologic: negative for visual changes, syncope, or dizziness Endocrine: no diabetes, no hypothyroidism Immunological: no lymph adenopathy Psych: non homicidal/suicidal  Physical Exam: Vitals: BP 203/104 mmHg  Pulse 69  Temp(Src) 97.9 F (36.6 C)  (Oral)  Resp 15  Ht  (1.778 m)  Wt 89.812 kg (198 lb)  BMI 28.41 kg/m2  SpO2 100% General: not in acute distress Neck: JVP flat, neck supple Heart: regular rate and rhythm, S1, S2, grade II/VI ejection systolic murmur at aortic region Lungs: CTAB  GI: non tender, non distended, bowel sounds present Extremities: no edema Neuro: AAO x 3  Psych: normal affect, no anxiety   Labs:   Results for orders placed or performed during the hospital encounter of 01/20/16 (from the past 24 hour(s))  Basic metabolic panel     Status: Abnormal   Collection Time: 01/20/16 10:12 PM  Result Value Ref Range   Sodium 139 135 - 145 mmol/L   Potassium 3.7 3.5 - 5.1 mmol/L   Chloride 103 101 - 111 mmol/L   CO2 24 22 - 32 mmol/L   Glucose, Bld 147 (H) 65 - 99 mg/dL   BUN 8 6 - 20 mg/dL   Creatinine, Ser 8.29 (H) 0.61 - 1.24 mg/dL   Calcium 9.3 8.9 - 56.2 mg/dL   GFR calc non Af Amer 58 (L) >60 mL/min   GFR calc Af Amer >60 >60 mL/min   Anion gap 12 5 - 15  CBC     Status: Abnormal   Collection Time: 01/20/16 10:12 PM  Result Value Ref Range   WBC 10.9 (H) 4.0 - 10.5 K/uL   RBC 4.28 4.22 - 5.81 MIL/uL   Hemoglobin 13.9 13.0 - 17.0 g/dL   HCT 13.0 86.5 - 78.4 %   MCV 97.2 78.0 - 100.0 fL   MCH 32.5 26.0 - 34.0 pg   MCHC 33.4 30.0 - 36.0 g/dL   RDW 69.6 29.5 - 28.4 %   Platelets 233 150 - 400 K/uL  I-stat troponin, ED     Status: None   Collection Time: 01/20/16 10:27 PM  Result Value Ref Range   Troponin i, poc 0.00 0.00 - 0.08 ng/mL   Comment 3          I-stat troponin, ED     Status: Abnormal   Collection Time: 01/21/16  2:32 AM  Result Value Ref Range   Troponin i, poc 0.16 (HH) 0.00 - 0.08 ng/mL   Comment NOTIFIED PHYSICIAN    Comment 3             Radiology/Studies: Dg Chest 2 View  01/20/2016  CLINICAL DATA:  Left-sided chest pain since last night. EXAM: CHEST  2 VIEW COMPARISON:  None. FINDINGS: The heart is normal in size, mild tortuosity of the thoracic aorta. Pulmonary  vasculature is normal. No consolidation, pleural effusion, or pneumothorax. No acute osseous abnormalities are seen. Degenerative change in the thoracic spine. IMPRESSION: No acute pulmonary process. Electronically Signed   By:  Rubye OaksMelanie  Ehinger M.D.   On: 01/20/2016 23:43    EKG: normal sinus rhythm, normal axis, no ST/T wave changes  Echo: pending  Cardiac cath: none  Medical decision making:  Discussed care with the patient Discussed care with the ED physician on the phone Reviewed labs and imaging personally Reviewed prior records  ASSESSMENT AND PLAN:  This is a 65 y.o. male with hypertension, tobacco abuse, hiatal hernia and back surgery presented with chest pain.     Active Problems:   NSTEMI (non-ST elevated myocardial infarction) (HCC)  NSTEMI Cycle troponin, serial EKGs prn, lipid panel, TSH, HbA1c, echocardiogram in the AM IV heparin, aspirin, high dose statin (crestor 20 mg or lipitor 80 mg daily), Consider cardiac catheterization   Hypertensive emergency with elevated troponin Checking BNP, will start on NTG to control bp  Avoid NSAIDs, discussed with the patient  Tobacco abuse Counseled for tobacco use cessation  GERD Started on protonix IV BID, can be converted back to PPI daily on discharge  Systolic murmur of aorta Ordered echocardiogram  Signed, Joellyn RuedQURESHI, Leanette Eutsler T, MD MS 01/21/2016, 3:53 AM

## 2016-01-21 NOTE — Progress Notes (Signed)
CRITICAL VALUE ALERT  Critical value received:  Troponin 2.91  Date of notification:  01/21/16  Time of notification:  12:53 pm  Critical value read back:Yes.    Nurse who received alert:  Berdine DanceLauren Moffitt  MD notified (1st page):  Dr. Tonny BollmanMichael Cooper  Time of first page:  1300  MD notified (2nd page): Randall AnBrittany Strader (PA)  Time of second page: 1314  Responding MD: Randall AnBrittany Strader (PA)  Time MD responded:  [phone]

## 2016-01-21 NOTE — Progress Notes (Signed)
ANTICOAGULATION CONSULT NOTE - Initial Consult  Pharmacy Consult for Heparin Indication: chest pain/ACS  Allergies  Allergen Reactions  . Asa [Aspirin]     Burns stomach    Patient Measurements: Height: 5\' 10"  (177.8 cm) Weight: 196 lb 6.9 oz (89.1 kg) IBW/kg (Calculated) : 73 Heparin Dosing Weight:  89.1 kg  Vital Signs: Temp: 98.2 F (36.8 C) (04/17 0546) Temp Source: Oral (04/17 0546) BP: 137/83 mmHg (04/17 0932) Pulse Rate: 70 (04/17 0932)  Labs:  Recent Labs  01/20/16 2212 01/21/16 0245 01/21/16 0617  HGB 13.9  --  13.5  HCT 41.6  --  40.2  PLT 233  --  195  LABPROT  --   --  14.1  INR  --   --  1.07  CREATININE 1.27*  --  1.22  TROPONINI  --  0.56* 1.15*    Estimated Creatinine Clearance: 69.6 mL/min (by C-G formula based on Cr of 1.22).   Medical History: Past Medical History  Diagnosis Date  . Allergy   . Arthritis   . Blood transfusion without reported diagnosis     Medications:  Prescriptions prior to admission  Medication Sig Dispense Refill Last Dose  . acetaminophen (TYLENOL) 500 MG tablet Take 1 tablet (500 mg total) by mouth every 6 (six) hours as needed. (Patient taking differently: Take 500 mg by mouth every 6 (six) hours as needed for mild pain or fever. ) 30 tablet 0 unk  . ibuprofen (ADVIL,MOTRIN) 400 MG tablet Take 1 tablet (400 mg total) by mouth every 8 (eight) hours as needed. (Patient taking differently: Take 400 mg by mouth every 8 (eight) hours as needed for moderate pain. ) 30 tablet 0 unk  . OVER THE COUNTER MEDICATION Take 1,000 mg by mouth daily. Glucosamine 1000 mg taking   Past Week at Unknown time  . OVER THE COUNTER MEDICATION Take 900 mg by mouth daily. Saw Palmetto 900 mg taking   Past Week at Unknown time  . OVER THE COUNTER MEDICATION Take 100 mg by mouth daily. Garlic Tablets 161100 mg taking   Past Week at Unknown time  . ranitidine (ZANTAC) 150 MG tablet Take 150 mg by mouth 2 (two) times daily.   01/20/2016 at Unknown  time    Assessment: NSTEMI  65 y/o M on no prescription meds PTA presents with burning CP. EKG with no ischemic changes. Hypertensive urgency in ED. +enzymes  PMH: +tobacco, hiatal hernia, arthritis  Anticoagulation: Start IV heparin for NSTEMI. Baseline CBC WNL.  Goal of Therapy:  Heparin level 0.3-0.7 units/ml Monitor platelets by anticoagulation protocol: Yes   Plan:  Heparin 4000 unit IV bolus Heparin infusion at 1250 units/hr Check heparin level in 6-8 hrs Daily HL and CBC   Orlin Kann S. Merilynn Finlandobertson, PharmD, BCPS Clinical Staff Pharmacist Pager [phone]  Misty Stanleyobertson, Ashtyn Meland Stillinger 01/21/2016,10:53 AM

## 2016-01-22 ENCOUNTER — Encounter (HOSPITAL_COMMUNITY): Payer: Self-pay | Admitting: Cardiology

## 2016-01-22 ENCOUNTER — Other Ambulatory Visit: Payer: Self-pay | Admitting: Physician Assistant

## 2016-01-22 ENCOUNTER — Telehealth: Payer: Self-pay | Admitting: Physician Assistant

## 2016-01-22 ENCOUNTER — Other Ambulatory Visit (HOSPITAL_COMMUNITY): Payer: BC Managed Care – PPO

## 2016-01-22 LAB — HEMOGLOBIN A1C
HEMOGLOBIN A1C: 5.9 % — AB (ref 4.8–5.6)
Mean Plasma Glucose: 123 mg/dL

## 2016-01-22 LAB — BASIC METABOLIC PANEL
Anion gap: 11 (ref 5–15)
BUN: 7 mg/dL (ref 6–20)
CALCIUM: 9.1 mg/dL (ref 8.9–10.3)
CO2: 22 mmol/L (ref 22–32)
Chloride: 107 mmol/L (ref 101–111)
Creatinine, Ser: 1.07 mg/dL (ref 0.61–1.24)
GFR calc Af Amer: >60 mL/min (ref >60)
GLUCOSE: 121 mg/dL — AB (ref 65–99)
Potassium: 3.8 mmol/L (ref 3.5–5.1)
Sodium: 140 mmol/L (ref 135–145)

## 2016-01-22 MED ORDER — HEART ATTACK BOUNCING BOOK
Freq: Once | Status: DC
  Filled 2016-01-22: qty 1

## 2016-01-22 MED ORDER — TICAGRELOR 90 MG PO TABS: 90.0000 mg | ORAL_TABLET | Freq: Two times a day (BID) | ORAL | Status: DC

## 2016-01-22 MED ORDER — ATORVASTATIN CALCIUM 80 MG PO TABS: 80.0000 mg | ORAL_TABLET | Freq: Every day | ORAL | Status: DC

## 2016-01-22 MED ORDER — ANGIOPLASTY BOOK
Freq: Once | Status: DC
  Filled 2016-01-22: qty 1

## 2016-01-22 MED ORDER — METOPROLOL TARTRATE 25 MG PO TABS: 25.0000 mg | ORAL_TABLET | Freq: Two times a day (BID) | ORAL | Status: DC

## 2016-01-22 MED ORDER — ASPIRIN 81 MG PO TBEC: 81.0000 mg | DELAYED_RELEASE_TABLET | Freq: Every day | ORAL | Status: AC

## 2016-01-22 MED ORDER — METOPROLOL TARTRATE 25 MG PO TABS
25.0000 mg | ORAL_TABLET | Freq: Two times a day (BID) | ORAL | Status: DC
  Administered 2016-01-22: 11:00:00 25 mg via ORAL
  Filled 2016-01-22: qty 1

## 2016-01-22 MED ORDER — NITROGLYCERIN 0.4 MG SL SUBL: 0.4000 mg | SUBLINGUAL_TABLET | SUBLINGUAL | Status: DC | PRN

## 2016-01-22 NOTE — Discharge Instructions (Addendum)
STOP TAKING THE FOLLOWING MEDICATIONS:  IBUPROFEN, ALEVE, MOTRIN, NAPROSYN, OR ANY NSAIDS (NON-STEROIDAL ANTI INFLAMATORY DRUGS)

## 2016-01-22 NOTE — Discharge Summary (Signed)
Discharge Summary    Patient ID: Billy Riley,  MRN: [id number], DOB/AGE: 64/01/1951 64 y.o.  Admit date: 01/20/2016 Discharge date: 01/22/2016  Primary Care Provider: Dolores Riley Primary Cardiologist: New - Dr. Excell Riley  Discharge Diagnoses    Principal Problem:   NSTEMI (non-ST elevated myocardial infarction) [facility]) Active Problems:   DDD (degenerative disc disease), lumbar   Tobacco user   Hypertensive urgency   Ischemic chest pain (HCC)   Tobacco dependence   Systolic murmur of aorta   History of Present Illness     Billy Riley is a 65 yo male with past medical history of tobacco abuse and no prior history of CAD who presented to [facility] on 01/20/2016 for evaluation of chest pain. He reported his pain resembled previous episodes of acid reflux but was not relieved with Zantac. He denied any associated symptoms.  While in the ED, his initial troponin was found to be elevated to 0.56 and his EKG showed nonspecific lateral ST changes. He was admitted for further evaluation.   Hospital Course     Consultants: None  He denied any repeat symptoms the following morning. His troponin continued to trend upwards and he was started on Heparin (peaked at 3.99 during the admission). His LDL was found to be elevated to 133 and he was started on high-dose statin therapy. With his symptoms, cardiac risk factors, and elevated troponin values, cardiac catheterization was recommended. The risks and benefits of the procedure were discussed with the patient and he agreed to proceed. His catheterization showed 100% stenosis of the Ost 2nd Diag. PCI was performed with POBA only due to the vessel being too small for a stent. Was started on DAPT with ASA and Brilinta (for one month then Brilinta or Plavix daily afterwards). No complications were noted following the procedure. The full report is included below.  Overnight, he denied any repeat episodes of chest discomfort. His right radial  cath site was without ecchymosis or evidence of a hematoma. His BB was further titrated for better BP control. Labs were reviewed and appeared stable.   He ambulated over 800 ft with cardiac rehab without any anginal symptoms. Smoking cessation was advised multiple times throughout the admission. He was last examined by Dr. Excell Riley and deemed stable for discharge. He was given Rx's for his new cardiac medications including a printed Brilinta Rx to use with his co-pay card. A 7-10 day TOC appointment was arranged prior to discharge. _____________  Discharge Vitals Blood pressure 171/107, pulse 90, temperature 97 F (36.1 C), temperature source Oral, resp. rate 15, height  (1.778 m), weight 196 lb 6.9 oz (89.1 kg), SpO2 98 %.  Filed Weights   01/20/16 2152 01/21/16 0546  Weight: 198 lb (89.812 kg) 196 lb 6.9 oz (89.1 kg)    Labs & Radiologic Studies     CBC  Recent Labs  01/20/16 2212 01/21/16 0617  WBC 10.9* 9.7  HGB 13.9 13.5  HCT 41.6 40.2  MCV 97.2 96.4  PLT 233 195   Basic Metabolic Panel  Recent Labs  01/21/16 0617 01/22/16 0722  NA 138 140  K 3.7 3.8  CL 106 107  CO2 23 22  GLUCOSE 128* 121*  BUN 8 7  CREATININE 1.22 1.07  CALCIUM 9.1 9.1   Liver Function Tests  Recent Labs  01/21/16 0617  AST 26  ALT 18  ALKPHOS 76  BILITOT 0.5  PROT 6.2*  ALBUMIN 3.8   No results for  input(s): LIPASE, AMYLASE in the last 72 hours. Cardiac Enzymes  Recent Labs  01/21/16 0617 01/21/16 1134 01/21/16 2155  TROPONINI 1.15* 2.91* 3.99*   BNP Invalid input(s): POCBNP D-Dimer No results for input(s): DDIMER in the last 72 hours. Hemoglobin A1C  Recent Labs  01/21/16 0617  HGBA1C 5.9*   Fasting Lipid Panel  Recent Labs  01/21/16 0617  CHOL 190  HDL 39*  LDLCALC 133*  TRIG 89  CHOLHDL 4.9   Thyroid Function Tests  Recent Labs  01/21/16 0617  TSH 0.998    Dg Chest 2 View: 01/20/2016  CLINICAL DATA:  Left-sided chest pain since last night.  EXAM: CHEST  2 VIEW COMPARISON:  None. FINDINGS: The heart is normal in size, mild tortuosity of the thoracic aorta. Pulmonary vasculature is normal. No consolidation, pleural effusion, or pneumothorax. No acute osseous abnormalities are seen. Degenerative change in the thoracic spine. IMPRESSION: No acute pulmonary process. Electronically Signed   By: Rubye OaksMelanie  Ehinger M.D.   On: 01/20/2016 23:43    Diagnostic Studies/Procedures     Cardiac Catheterization: 01/21/2016      1. Ost 2nd Diag to 2nd Diag lesion, 100% stenosed. Post POBA intervention, there is a 10% residual stenosis. 2. The left ventricular systolic function is normal.  Culprit for NSTEMI is 100% ostial-proximal D2 that was too small for Stent PCI - successful PTCA/POBA only.  Plan:  Would continue ASA + Brilinta x 1 month & continue Brilinta or Plavix daily after  Continue RF Modification  Expected d/c in AM.   Disposition   Pt is being discharged home today in good condition.  Follow-up Plans & Appointments    Follow-up Information    Follow up with Billy HoraHOMPSON, KATHRYN R, PA-C On 02/01/2016.   Specialties:  Cardiology, Radiology   Why:  Cardiology Hospital Follow-Up on 02/01/2016 at 9:30AM.   Contact information:   [address] [place] [id number] [phone]      Discharge Instructions    Amb Referral to Cardiac Rehabilitation    Complete by:  As directed   Diagnosis:   NSTEMI PTCA       Diet - low sodium heart healthy    Complete by:  As directed      Discharge instructions    Complete by:  As directed   PLEASE REMEMBER TO BRING ALL OF YOUR MEDICATIONS TO EACH OF YOUR FOLLOW-UP OFFICE VISITS.  PLEASE ATTEND ALL SCHEDULED FOLLOW-UP APPOINTMENTS.   Activity: Increase activity slowly as tolerated. You may shower, but no soaking baths (or swimming) for 1 week. No driving for 24 hours. No lifting over 5 lbs for 1 week. No sexual activity for 1 week.   You May Return to Work: in 1  week (if applicable)  Wound Care: You may wash cath site gently with soap and water. Keep cath site clean and dry. If you notice pain, swelling, bleeding or pus at your cath site, please call [phone].     Increase activity slowly    Complete by:  As directed            Discharge Medications   Discharge Medication List as of 01/22/2016 12:43 PM    START taking these medications   Details  aspirin EC 81 MG EC tablet Take 1 tablet (81 mg total) by mouth daily., Starting 01/22/2016, Until Discontinued, OTC    atorvastatin (LIPITOR) 80 MG tablet Take 1 tablet (80 mg total) by mouth daily at 6 PM., Starting 01/22/2016, Until  Discontinued, Normal    metoprolol tartrate (LOPRESSOR) 25 MG tablet Take 1 tablet (25 mg total) by mouth 2 (two) times daily., Starting 01/22/2016, Until Discontinued, Normal    nitroGLYCERIN (NITROSTAT) 0.4 MG SL tablet Place 1 tablet (0.4 mg total) under the tongue every 5 (five) minutes as needed for chest pain., Starting 01/22/2016, Until Discontinued, Normal      CONTINUE these medications which have CHANGED   Details  ticagrelor (BRILINTA) 90 MG TABS tablet Take 1 tablet (90 mg total) by mouth 2 (two) times daily., Starting 01/22/2016, Until Discontinued, Print      CONTINUE these medications which have NOT CHANGED   Details  acetaminophen (TYLENOL) 500 MG tablet Take 1 tablet (500 mg total) by mouth every 6 (six) hours as needed., Starting 01/08/2015, Until Discontinued, Normal    !! OVER THE COUNTER MEDICATION Take 1,000 mg by mouth daily. Glucosamine 1000 mg taking, Until Discontinued, Historical Med    !! OVER THE COUNTER MEDICATION Take 900 mg by mouth daily. Saw Palmetto 900 mg taking, Until Discontinued, Historical Med    !! OVER THE COUNTER MEDICATION Take 100 mg by mouth daily. Garlic Tablets 161 mg taking, Until Discontinued, Historical Med    ranitidine (ZANTAC) 150 MG tablet Take 150 mg by mouth 2 (two) times daily., Until Discontinued,  Historical Med     !! - Potential duplicate medications found. Please discuss with provider.    STOP taking these medications     ibuprofen (ADVIL,MOTRIN) 400 MG tablet          Aspirin prescribed at discharge?  Yes High Intensity Statin Prescribed? (Lipitor 40-80mg  or Crestor 20-40mg ): Yes Beta Blocker Prescribed? Yes For EF 45% or less, Was ACEI/ARB Prescribed? No: N/A ADP Receptor Inhibitor Prescribed? (i.e. Plavix etc.-Includes Medically Managed Patients): Yes For EF <40%, Aldosterone Inhibitor Prescribed? No: N/A Was EF assessed during THIS hospitalization? Yes Was Cardiac Rehab II ordered? (Included Medically managed Patients): Yes   Allergies Allergies  Allergen Reactions  . Asa [Aspirin]     Burns stomach     Outstanding Labs/Studies   None  Duration of Discharge Encounter   Greater than 30 minutes including physician time.  Signed, Ellsworth Lennox, PA-C 01/22/2016, 9:10 PM

## 2016-01-22 NOTE — Progress Notes (Signed)
TR BAND REMOVAL  LOCATION:    right radial  DEFLATED PER PROTOCOL:    Yes.    TIME BAND OFF / DRESSING APPLIED:    2200   SITE UPON ARRIVAL:    Level 0  SITE AFTER BAND REMOVAL:    Level 0  CIRCULATION SENSATION AND MOVEMENT:    Within Normal Limits   Yes.    COMMENTS:   Site remains stable with frequent assessments. Dressing dry and intact. Pulses remain palpable +2 ulnar and radial right upper extremity, CSMs wnls. Patient c/o discomfort at right radial site while band on and stated no discomfort after band removed and dressing applied at 2200.

## 2016-01-22 NOTE — Progress Notes (Signed)
CARDIAC REHAB PHASE I   PRE:  Rate/Rhythm: 103 ST  BP:  Supine:   Sitting: 152/90  Standing:    SaO2:   MODE:  Ambulation: 800 ft   POST:  Rate/Rhythm: 102 ST  BP:  Supine:   Sitting: 170/96  Standing:    SaO2:  0755-0900 Pt walked 800 ft with steady gait. Tolerated well. No CP. MI education completed and pt voiced understanding. Reviewed NTG use, MI restrictions, heart healthy diet, risk factors and ex ed. Discussed smoking cessation and gave handout. Pt plans to quit cold [place]. Discussed CRP 2 and will refer to [place] program.   Luetta Nuttingharlene Laylani Pudwill, RN BSN  01/22/2016 8:53 AM

## 2016-01-22 NOTE — Care Management Note (Signed)
Case Management Note  Patient Details  Name: Billy Riley MRN: [id number] Date of Birth: March 08, 1951  Subjective/Objective:  Patient is from home, pta indep, will be on Brilinta, co pay is $45, NCM gave patient 30 day savings card, [facility] [address] and they do have in stock. Patient has transport at discharge, no other needs.                    Action/Plan:   Expected Discharge Date:                  Expected Discharge Plan:  Home/Self Care  In-House Referral:     Discharge planning Services  CM Consult  Post Acute Care Choice:    Choice offered to:     DME Arranged:    DME Agency:     HH Arranged:    HH Agency:     Status of Service:  Completed, signed off  Medicare Important Message Given:    Date Medicare IM Given:    Medicare IM give by:    Date Additional Medicare IM Given:    Additional Medicare Important Message give by:     If discussed at Long Length of Stay Meetings, dates discussed:    Additional Comments:  Leone Havenaylor, Elin Seats [place], RN 01/22/2016, 10:58 AM

## 2016-01-22 NOTE — Telephone Encounter (Signed)
TCM phone call .Marland Kitchen. Appt is on 02/01/16 at 9:30am w/ Carlean JewsKatie Thompson [facility]    Thanks

## 2016-01-22 NOTE — Progress Notes (Signed)
Hospital Problem List     Principal Problem:   NSTEMI (non-ST elevated myocardial infarction) (HCC) Active Problems:   DDD (degenerative disc disease), lumbar   Tobacco user   Hypertensive urgency   Ischemic chest pain (HCC)   Tobacco dependence   Systolic murmur of aorta    Patient Profile:   Primary Cardiologist: New  65 yo male w/ PMH of tobacco abuse and no prior history of CAD who [facility] for evaluation of chest pain. Troponin peak of 3.99.  Subjective   Denies any chest pain or palpitations overnight. Has been ambulating without difficulty.  Inpatient Medications    . angioplasty book   Does not apply Once  . aspirin EC  81 mg Oral Daily  . atorvastatin  80 mg Oral q1800  . heart attack bouncing book   Does not apply Once  . metoprolol tartrate  12.5 mg Oral BID  . pantoprazole (PROTONIX) IV  40 mg Intravenous Q12H  . sodium chloride flush  3 mL Intravenous Q12H  . sodium chloride flush  3 mL Intravenous Q12H  . ticagrelor  90 mg Oral BID    Vital Signs    Filed Vitals:   01/22/16 0050 01/22/16 0100 01/22/16 0530 01/22/16 0726  BP: 151/98 146/94 149/94 152/90  Pulse: 67 65  75  Temp:   98.1 F (36.7 C) 98 F (36.7 C)  TempSrc:   Oral   Resp: 14 30 17 18   Height:      Weight:      SpO2: 98% 96%  95%    Intake/Output Summary (Last 24 hours) at 01/22/16 0743 Last data filed at 01/22/16 [id number]  Gross per 24 hour  Intake 1369.2 ml  Output   1150 ml  Net  219.2 ml   Filed Weights   01/20/16 2152 01/21/16 0546  Weight: 198 lb (89.812 kg) 196 lb 6.9 oz (89.1 kg)    Physical Exam    General: Well developed, well nourished, male appearing in no acute distress. Head: Normocephalic, atraumatic.  Neck: Supple without bruits, JVD not elevated. Lungs:  Resp regular and unlabored, CTA without wheezing or rales. Heart: RRR, S1, S2, no S3, S4, or murmur; no rub. Abdomen: Soft, non-tender, non-distended with normoactive bowel  sounds. No hepatomegaly. No rebound/guarding. No obvious abdominal masses. Extremities: No clubbing, cyanosis, or edema. Distal pedal pulses are 2+ bilaterally. Right radial cath site without ecchymosis or evidence of a hematoma. Neuro: Alert and oriented X 3. Moves all extremities spontaneously. Psych: Normal affect.  Labs    CBC  Recent Labs  01/20/16 2212 01/21/16 0617  WBC 10.9* 9.7  HGB 13.9 13.5  HCT 41.6 40.2  MCV 97.2 96.4  PLT 233 195   Basic Metabolic Panel  Recent Labs  01/20/16 2212 01/21/16 0617  NA 139 138  K 3.7 3.7  CL 103 106  CO2 24 23  GLUCOSE 147* 128*  BUN 8 8  CREATININE 1.27* 1.22  CALCIUM 9.3 9.1   Liver Function Tests  Recent Labs  01/21/16 0617  AST 26  ALT 18  ALKPHOS 76  BILITOT 0.5  PROT 6.2*  ALBUMIN 3.8   No results for input(s): LIPASE, AMYLASE in the last 72 hours. Cardiac Enzymes  Recent Labs  01/21/16 0617 01/21/16 1134 01/21/16 2155  TROPONINI 1.15* 2.91* 3.99*   BNP Invalid input(s): POCBNP D-Dimer No results for input(s): DDIMER in the last 72 hours. Hemoglobin A1C  Recent Labs  01/21/16  0617  HGBA1C 5.9*   Fasting Lipid Panel  Recent Labs  01/21/16 0617  CHOL 190  HDL 39*  LDLCALC 133*  TRIG 89  CHOLHDL 4.9   Thyroid Function Tests  Recent Labs  01/21/16 0617  TSH 0.998    Telemetry    NSR, HR in 60's  -70's. Occasional PVC's.  ECG    NSR, HR 74, no acute ST or T-wave changes.   Cardiac Studies and Radiology    Dg Chest 2 View: 01/20/2016  CLINICAL DATA:  Left-sided chest pain since last night. EXAM: CHEST  2 VIEW COMPARISON:  None. FINDINGS: The heart is normal in size, mild tortuosity of the thoracic aorta. Pulmonary vasculature is normal. No consolidation, pleural effusion, or pneumothorax. No acute osseous abnormalities are seen. Degenerative change in the thoracic spine. IMPRESSION: No acute pulmonary process. Electronically Signed   By: Rubye Oaks M.D.   On: 01/20/2016  23:43    Cardiac Catheterization: 01/21/2016  1. Ost 2nd Diag to 2nd Diag lesion, 100% stenosed. Post POBA intervention, there is a 10% residual stenosis. 2. The left ventricular systolic function is normal.  Culprit for NSTEMI is 100% ostial-proximal D2 that was too small for Stent PCI - successful PTCA/POBA only.  Plan:  Would continue ASA + Brilinta x 1 month & continue Brilinta or Plavix daily after  Continue RF Modification  Expected d/c in AM.  Assessment & Plan    1. NSTEMI - presented with a burning sensation in his chest. Reports it was similar to previous episodes of acid reflux but did not resolve after taking Zantac. Denies any associated symptoms. Pain resolved after being placed on NTG drip. - Cyclic troponin values have been 0.56, 1.15, 2.91, and 3.99. - Cardiac cath showed 100% stenosis of the Ost 2nd Diag. PCI was performed with POBA only due to the vessel being too small for a stent. Was started on DAPT with ASA and Brilinta (for one month then Brilinta or Plavix daily afterwards). - continue BB and high-dose statin.   2. Hypertensive Emergency - BP was initially elevated into the low 200's/ 110's upon arrival. Currently improved into the 140's /90's.  - will titrate BB. May need to consider addition of an ACE-I as an outpatient if BP remains elevated.   3. Tobacco Abuse - smoking cessation advised.  4. HLD - LDL elevated to 133. - started on high-dose statin.  Lorri Frederick , PA-C 7:43 AM 01/22/2016 Pager: [phone]  Patient seen, examined. Available data reviewed. Agree with findings, assessment, and plan as outlined by Randall An, PA-C. Right radial site is clear. Lungs CTA< heart RRR, no peripheral edema. Cath result reviewed and patient with single vessel disease s/p POBA because of small caliber vessel. OK for DC today on ASA 81 mg and brilinta 90 mg BID. Agree with antihypertensives and statin as outlined above. FU 1-2  weeks.  Tonny Bollman, M.D. 01/22/2016 11:06 AM

## 2016-01-23 NOTE — Telephone Encounter (Signed)
[facility] on 01/22/16.  Patient understands to follow up with provider Carlean JewsKatie Thompson on 02/01/16 at 9:30 am [facility]. Patient understands discharge instructions? Yes Patient understands medications and regiment? Yes Patient understands to bring all medications to this visit? Yes

## 2016-01-28 NOTE — Progress Notes (Signed)
Cardiology Office Note    Date:  02/01/2016   ID:  Billy Riley, DOB [birth year], MRN [id number]  PCP:  Dolores Lory, PA-C  Cardiologist:  Dr. Excell Seltzer  Post hospital follow-up  History of Present Illness:  Billy Riley is a 65 y.o. male with a history of tobacco abuse and recently diagnosed HTN, HLD and CAD: NSTEMI s/p POBA to ostial D2 who presents for post hospital follow-up.  He was admitted 4/16-4/18/17. He presented with chest pain and ruled in for a NSTEMI. He was quite hypertensive initially with blood pressures elevated to low 200's/ 110's upon arrival. His troponin continued to trend upwards and he was started on heparin (peaked at 3.99 during the admission). His LDL was found to be elevated to 133 and he was started on high-dose statin therapy. He underwent cardiac catheterization on 01/21/16 which showed 100% stenosis of the Ost 2nd Diag and normal LV function. PCI was performed with POBA only due to the vessel being too small for a stent. He was started on DAPT with ASA and Brilinta (for one month then Brilinta or Plavix daily afterwards). His beta blocker was also up titrated to treat hypertension.  Today he presents to clinic for follow-up. No CP or SOB. He is really trying to watch his diet. He is eating a heart healthy diet. He has walking for exercise. He walks at least 10-15 minutes a day. He is planning to work with cardiac rehab. He is now is retired from [employer] in [employer]. He now cuts hair part time. No LE edema, orthopnea or PND.  No dizziness or syncope. No blood in his stool or urine. He has quit smoking. It has been 10 days.   Past Medical History  Diagnosis Date  . Allergy   . Arthritis   . History of blood transfusion 2006    "w/my back OR"  . HTN (hypertension)   . HLD (hyperlipidemia)     a. 01/2016: initiated on statin therapy  . History of hiatal hernia   . Coronary artery disease     a. 01/2016: NSTEMI w/  100% stenosis of Ost 2nd Diag --> POBA due to small vessel size, started on DAPT  . GERD (gastroesophageal reflux disease)     Past Surgical History  Procedure Laterality Date  . Back surgery    . Posterior lumbar fusion  2006    "L4-5"  . Cardiac catheterization  2006  . Coronary angioplasty  01/21/2016  . Cardiac catheterization N/A 01/21/2016    Procedure: Left Heart Cath and Coronary Angiography;  Surgeon: Marykay Lex, MD;  Location: [facility] INVASIVE CV LAB;  Service: Cardiovascular;  Laterality: N/A;  . Cardiac catheterization N/A 01/21/2016    Procedure: Coronary Stent Intervention;  Surgeon: Marykay Lex, MD;  Location: [facility] INVASIVE CV LAB;  Service: Cardiovascular;  Laterality: N/A;    Current Medications: Outpatient Prescriptions Prior to Visit  Medication Sig Dispense Refill  . aspirin EC 81 MG EC tablet Take 1 tablet (81 mg total) by mouth daily.    Marland Kitchen atorvastatin (LIPITOR) 80 MG tablet Take 1 tablet (80 mg total) by mouth daily at 6 PM. 30 tablet 6  . metoprolol tartrate (LOPRESSOR) 25 MG tablet Take 1 tablet (25 mg total) by mouth 2 (two) times daily. 60 tablet 6  . nitroGLYCERIN (NITROSTAT) 0.4 MG SL tablet Place 1 tablet (0.4 mg total) under the tongue every 5 (five) minutes as needed for chest pain. 25 tablet  3  . OVER THE COUNTER MEDICATION Take 1,000 mg by mouth daily. Glucosamine 1000 mg taking    . OVER THE COUNTER MEDICATION Take 900 mg by mouth daily. Saw Palmetto 900 mg taking    . OVER THE COUNTER MEDICATION Take 100 mg by mouth daily. Garlic Tablets 161 mg taking    . ranitidine (ZANTAC) 150 MG tablet Take 150 mg by mouth 2 (two) times daily.    . ticagrelor (BRILINTA) 90 MG TABS tablet Take 1 tablet (90 mg total) by mouth 2 (two) times daily. 180 tablet 3  . acetaminophen (TYLENOL) 500 MG tablet Take 1 tablet (500 mg total) by mouth every 6 (six) hours as needed. (Patient taking differently: Take 500 mg by mouth every 6 (six) hours as needed for mild pain or  fever. ) 30 tablet 0   No facility-administered medications prior to visit.     Allergies:   Asa   Social History   Social History  . Marital Status: Married    Spouse Name: N/A  . Number of Children: N/A  . Years of Education: N/A   Social History Main Topics  . Smoking status: Current Every Day Smoker -- 0.12 packs/day for 48 years    Types: Cigarettes  . Smokeless tobacco: Never Used  . Alcohol Use: No  . Drug Use: No  . Sexual Activity: Yes   Other Topics Concern  . None   Social History Narrative     Family History:  The patient's family history includes Cancer in his mother and sister; Diabetes in his sister; Heart disease in his brother and father.   ROS:   Please see the history of present illness.    ROS All other systems reviewed and are negative.   PHYSICAL EXAM:   VS:  BP 148/82 mmHg  Pulse 74  Ht  (1.778 m)  Wt 192 lb (87.091 kg)  BMI 27.55 kg/m2   GEN: Well nourished, well developed, in no acute distress HEENT: normal Neck: no JVD, carotid bruits, or masses Cardiac: RRR; no murmurs, rubs, or gallops,no edema  Respiratory:  clear to auscultation bilaterally, normal work of breathing GI: soft, nontender, nondistended, + BS MS: no deformity or atrophy Skin: warm and dry, no rash Neuro:  Alert and Oriented x 3, Strength and sensation are intact Psych: euthymic mood, full affect  Wt Readings from Last 3 Encounters:  02/01/16 192 lb (87.091 kg)  01/21/16 196 lb 6.9 oz (89.1 kg)  01/08/15 196 lb 3.2 oz (88.996 kg)      Studies/Labs Reviewed:   EKG:  EKG is NOT ordered today.   Recent Labs: 01/21/2016: ALT 18; Hemoglobin 13.5; Platelets 195; TSH 0.998 01/22/2016: BUN 7; Creatinine, Ser 1.07; Potassium 3.8; Sodium 140   Lipid Panel    Component Value Date/Time   CHOL 190 01/21/2016 0617   TRIG 89 01/21/2016 0617   HDL 39* 01/21/2016 0617   CHOLHDL 4.9 01/21/2016 0617   VLDL 18 01/21/2016 0617   LDLCALC 133* 01/21/2016 0617     Additional studies/ records that were reviewed today include:  01/21/16 Procedures    Coronary Stent Intervention   Left Heart Cath and Coronary Angiography    Conclusion    1. Ost 2nd Diag to 2nd Diag lesion, 100% stenosed. Post POBA intervention, there is a 10% residual stenosis. 2. The left ventricular systolic function is normal.  Culprit for NSTEMI is 100% ostial-proximal D2 that was too small for Stent PCI - successful PTCA/POBA only.  Plan:  Would continue ASA + Brilinta x 1 month & continue Brilinta or Plavix daily after  Continue RF Modification       ASSESSMENT:    1. Coronary artery disease involving native coronary artery of native heart without angina pectoris   2. Essential hypertension   3. Tobacco abuse   4. HLD (hyperlipidemia)   5. Murmur      PLAN:  In order of problems listed above:  CAD s/p recent NSTEMI: cardiac cath showed 100% stenosis of the Ost 2nd Diag. PCI was performed with POBA only due to the vessel being too small for a stent. Was started on DAPT with ASA and Brilinta (for one month then Brilinta or Plavix daily afterwards). Continue BB and high-dose statin.   HTN:  BP 148/82 on Lopressor 25 mg twice a day. Will add Lisinopril 5mg  daily for better BP control.   Tobacco Abuse: he has quit smoking. I congratulated him.  HLD: recent lipid panel with LDL elevated to 133. Normal LFTs. Continue hight dose statin   Murmur: will get 2D ECHO   Medication Adjustments/Labs and Tests Ordered: Current medicines are reviewed at length with the patient today.  Concerns regarding medicines are outlined above.  Medication changes, Labs and Tests ordered today are listed in the Patient Instructions below. There are no Patient Instructions on file for this visit.   Charlestine MassedSigned, Shakea Isip R, PA-C  02/01/2016 9:32 AM    [facility] [address], [place], [place]  [address] Phone: [phone]; Fax: [phone]

## 2016-02-01 ENCOUNTER — Encounter: Payer: Self-pay | Admitting: Physician Assistant

## 2016-02-01 ENCOUNTER — Ambulatory Visit (INDEPENDENT_AMBULATORY_CARE_PROVIDER_SITE_OTHER): Payer: BC Managed Care – PPO | Admitting: Physician Assistant

## 2016-02-01 VITALS — BP 148/82 | HR 74 | Ht 70.0 in | Wt 192.0 lb

## 2016-02-01 DIAGNOSIS — E785 Hyperlipidemia, unspecified: Secondary | ICD-10-CM | POA: Diagnosis not present

## 2016-02-01 DIAGNOSIS — I10 Essential (primary) hypertension: Secondary | ICD-10-CM

## 2016-02-01 DIAGNOSIS — Z720 Tobacco use: Secondary | ICD-10-CM | POA: Diagnosis not present

## 2016-02-01 DIAGNOSIS — I2510 Atherosclerotic heart disease of native coronary artery without angina pectoris: Secondary | ICD-10-CM

## 2016-02-01 DIAGNOSIS — R011 Cardiac murmur, unspecified: Secondary | ICD-10-CM

## 2016-02-01 MED ORDER — LISINOPRIL 5 MG PO TABS: 5.0000 mg | ORAL_TABLET | Freq: Every day | ORAL | Status: DC

## 2016-02-01 NOTE — Patient Instructions (Signed)
Medication Instructions:  Your physician has recommended you make the following change in your medication:  1.  START the Lisinopril 5 mg taking 1 tablet daily   Labwork: None ordered  Testing/Procedures: Your physician has requested that you have an echocardiogram. Echocardiography is a painless test that uses sound waves to create images of your heart. It provides your doctor with information about the size and shape of your heart and how well your heart's chambers and valves are working. This procedure takes approximately one hour. There are no restrictions for this procedure.    Follow-Up: Your physician recommends that you schedule a follow-up appointment in: 3-4 MONTHS WITH DR. [name]   Any Other Special Instructions Will Be Listed Below (If Applicable). Echocardiogram An echocardiogram, or echocardiography, uses sound waves (ultrasound) to produce an image of your heart. The echocardiogram is simple, painless, obtained within a short period of time, and offers valuable information to your health care provider. The images from an echocardiogram can provide information such as:  Evidence of coronary artery disease (CAD).  Heart size.  Heart muscle function.  Heart valve function.  Aneurysm detection.  Evidence of a past heart attack.  Fluid buildup around the heart.  Heart muscle thickening.  Assess heart valve function. LET [facility] The[facility]OUR HEALTH CARE PROVIDER KNOW ABOUT:  Any allergies you have.  All medicines you are taking, including vitamins, herbs, eye drops, creams, and over-the-counter medicines.  Previous problems you or members of your family have had with the use of anesthetics.  Any blood disorders you have.  Previous surgeries you have had.  Medical conditions you have.  Possibility of pregnancy, if this applies. BEFORE THE PROCEDURE  No special preparation is needed. Eat and drink normally.  PROCEDURE   In order to produce an image of your heart, gel  will be applied to your chest and a wand-like tool (transducer) will be moved over your chest. The gel will help transmit the sound waves from the transducer. The sound waves will harmlessly bounce off your heart to allow the heart images to be captured in real-time motion. These images will then be recorded.  You may need an IV to receive a medicine that improves the quality of the pictures. AFTER THE PROCEDURE You may return to your normal schedule including diet, activities, and medicines, unless your health care provider tells you otherwise.   This information is not intended to replace advice given to you by your health care provider. Make sure you discuss any questions you have with your health care provider.   Document Released: 09/19/2000 Document Revised: 10/13/2014 Document Reviewed: 05/30/2013 Elsevier Interactive Patient Education [employer]2016 Elsevier Inc.     If you need a refill on your cardiac medications before your next appointment, please call your pharmacy.

## 2016-02-14 ENCOUNTER — Ambulatory Visit (HOSPITAL_COMMUNITY): Payer: BC Managed Care – PPO | Attending: Internal Medicine

## 2016-02-14 ENCOUNTER — Other Ambulatory Visit: Payer: Self-pay

## 2016-02-14 DIAGNOSIS — I071 Rheumatic tricuspid insufficiency: Secondary | ICD-10-CM | POA: Insufficient documentation

## 2016-02-14 DIAGNOSIS — E785 Hyperlipidemia, unspecified: Secondary | ICD-10-CM | POA: Insufficient documentation

## 2016-02-14 DIAGNOSIS — R011 Cardiac murmur, unspecified: Secondary | ICD-10-CM | POA: Diagnosis not present

## 2016-02-14 DIAGNOSIS — I10 Essential (primary) hypertension: Secondary | ICD-10-CM | POA: Insufficient documentation

## 2016-02-14 DIAGNOSIS — I2510 Atherosclerotic heart disease of native coronary artery without angina pectoris: Secondary | ICD-10-CM | POA: Diagnosis not present

## 2016-04-22 ENCOUNTER — Encounter: Payer: Self-pay | Admitting: *Deleted

## 2016-05-09 ENCOUNTER — Ambulatory Visit (INDEPENDENT_AMBULATORY_CARE_PROVIDER_SITE_OTHER): Payer: BC Managed Care – PPO | Admitting: Cardiovascular Disease

## 2016-05-09 ENCOUNTER — Encounter: Payer: Self-pay | Admitting: Cardiovascular Disease

## 2016-05-09 VITALS — BP 160/84 | HR 62 | Ht 70.0 in | Wt 198.0 lb

## 2016-05-09 DIAGNOSIS — M79604 Pain in right leg: Secondary | ICD-10-CM | POA: Diagnosis not present

## 2016-05-09 DIAGNOSIS — R0989 Other specified symptoms and signs involving the circulatory and respiratory systems: Secondary | ICD-10-CM | POA: Diagnosis not present

## 2016-05-09 DIAGNOSIS — I2510 Atherosclerotic heart disease of native coronary artery without angina pectoris: Secondary | ICD-10-CM | POA: Diagnosis not present

## 2016-05-09 DIAGNOSIS — I10 Essential (primary) hypertension: Secondary | ICD-10-CM

## 2016-05-09 MED ORDER — LOSARTAN POTASSIUM 50 MG PO TABS: 50.0000 mg | ORAL_TABLET | Freq: Every day | ORAL | 3 refills | Status: DC

## 2016-05-09 NOTE — Progress Notes (Signed)
Cardiology Office Note Date:  05/09/2016   ID:  Billy Riley, DOB [birth year], MRN [id number]  PCP:  Kathlen Brunswick, PA-C  Cardiologist:  Sherren Mocha, MD    Chief Complaint  Patient presents with  . Leg Pain    History of Present Illness: Billy Riley is a 65 y.o. male who presents for follow-up of coronary artery disease. The patient has a history of hypertension, hyperlipidemia, and tobacco abuse. He presented in April 2017 with a non-ST elevation infarction. He was found to have a totally occluded first diagonal branch and he underwent balloon angioplasty alone because of small vessel size. His post MI course was uncomplicated. Troponin peaked at 3.99. LV function was normal by evaluation at cardiac cath and echo. He presents today for follow-up evaluation.  The patient complains of an occasional left-sided chest pain under the left breast. It is not associated with physical exertion. Symptoms are self-limited. He attributes this to his acid reflux. States that they improve with Zantac. With physical activity, he complains of right calf pain. This is described as an ache. It occurs with short distance walking. He has tried to increase his walking program but remains limited because of right calf pain. He denies any symptoms in the left leg. He denies any resting symptoms. He denies any history of ulceration or nonhealing wounds in the feet. He is not limited by shortness of breath or other symptoms. He denies orthopnea, PND, or heart palpitations. He quit smoking at the time of his MI in April.   Past Medical History:  Diagnosis Date  . Allergy   . Arthritis   . Coronary artery disease    a. 01/2016: NSTEMI w/ 100% stenosis of Ost 2nd Diag --> POBA due to small vessel size, started on DAPT  . GERD (gastroesophageal reflux disease)   . History of blood transfusion 2006   "w/my back OR"  . History of hiatal hernia   . HLD (hyperlipidemia)    a. 01/2016: initiated on  statin therapy  . HTN (hypertension)     Past Surgical History:  Procedure Laterality Date  . BACK SURGERY    . CARDIAC CATHETERIZATION  2006  . CARDIAC CATHETERIZATION N/A 01/21/2016   Procedure: Left Heart Cath and Coronary Angiography;  Surgeon: Leonie Man, MD;  Location: [facility] LAB;  Service: Cardiovascular;  Laterality: N/A;  . CARDIAC CATHETERIZATION N/A 01/21/2016   Procedure: Coronary Stent Intervention;  Surgeon: Leonie Man, MD;  Location: [facility] CV LAB;  Service: Cardiovascular;  Laterality: N/A;  . CORONARY ANGIOPLASTY  01/21/2016  . POSTERIOR LUMBAR FUSION  2006   "L4-5"    Current Outpatient Prescriptions  Medication Sig Dispense Refill  . acetaminophen (TYLENOL) 500 MG tablet Take 500 mg by mouth every 6 (six) hours as needed for mild pain or headache.    Marland Kitchen aspirin EC 81 MG EC tablet Take 1 tablet (81 mg total) by mouth daily.    Marland Kitchen atorvastatin (LIPITOR) 80 MG tablet Take 1 tablet (80 mg total) by mouth daily at 6 PM. 30 tablet 6  . metoprolol tartrate (LOPRESSOR) 25 MG tablet Take 1 tablet (25 mg total) by mouth 2 (two) times daily. 60 tablet 6  . nitroGLYCERIN (NITROSTAT) 0.4 MG SL tablet Place 1 tablet (0.4 mg total) under the tongue every 5 (five) minutes as needed for chest pain. 25 tablet 3  . losartan (COZAAR) 50 MG tablet Take 1 tablet (50 mg total) by mouth daily. [place]  tablet 3  . ranitidine (ZANTAC) 150 MG tablet Take 150 mg by mouth 2 (two) times daily.    . ticagrelor (BRILINTA) 90 MG TABS tablet Take 1 tablet (90 mg total) by mouth 2 (two) times daily. (Patient not taking: Reported on 05/09/2016) 180 tablet 3   No current facility-administered medications for this visit.     Allergies:   Asa [aspirin] and Lisinopril   Social History:  The patient  reports that he has been smoking Cigarettes.  He has a 5.76 pack-year smoking history. He has never used smokeless tobacco. He reports that he does not drink alcohol or use drugs.   Family  History:  The patient's  family history includes Cancer in his mother and sister; Diabetes in his sister; Heart disease in his brother and father.    ROS:  Please see the history of present illness.  Otherwise, review of systems is positive for Cough.  All other systems are reviewed and negative.    PHYSICAL EXAM: VS:  BP (!) 160/84   Pulse 62   Ht _0  (1.778 m)   Wt 198 lb (89.8 kg)   SpO2 98%   BMI 28.41 kg/m  , BMI Body mass index is 28.41 kg/m. GEN: Well nourished, well developed, in no acute distress  HEENT: normal  Neck: no JVD, no masses. No carotid bruits Cardiac: RRR grade 2/6 systolic ejection murmur at the right upper sternal border            Respiratory:  clear to auscultation bilaterally, normal work of breathing GI: soft, nontender, nondistended, + BS MS: no deformity or atrophy  Ext: no pretibial edema, DP pulses are nonpalpable bilaterally, PT pulses are 2+ on the left and trace on the right Skin: warm and dry, no rash Neuro:  Strength and sensation are intact Psych: euthymic mood, full affect  EKG:  EKG is not ordered today.  Recent Labs: 01/21/2016: ALT 18; Hemoglobin 13.5; Platelets 195; TSH 0.998 01/22/2016: BUN 7; Creatinine, Ser 1.07; Potassium 3.8; Sodium 140   Lipid Panel     Component Value Date/Time   CHOL 190 01/21/2016 0617   TRIG 89 01/21/2016 0617   HDL 39 (L) 01/21/2016 0617   CHOLHDL 4.9 01/21/2016 0617   VLDL 18 01/21/2016 0617   LDLCALC 133 (H) 01/21/2016 0617      Wt Readings from Last 3 Encounters:  05/09/16 198 lb (89.8 kg)  02/01/16 192 lb (87.1 kg)  01/21/16 196 lb 6.9 oz (89.1 kg)     Cardiac Studies Reviewed: 2D Echo: Study Conclusions  - Left ventricle: The cavity size was normal. Wall thickness was   normal. Systolic function was normal. The estimated ejection   fraction was in the range of 55% to 60%. Wall motion was normal;   there were no regional wall motion abnormalities. Left   ventricular diastolic  function parameters were normal. - Aorta: Ascending aortic diameter: 39 mm (S). - Ascending aorta: The ascending aorta is top normal in size. - Left atrium: The atrium was normal in size. - Tricuspid valve: There was trivial regurgitation. - Pulmonary arteries: PA peak pressure: 26 mm Hg (S). - Inferior vena cava: The vessel was normal in size. The   respirophasic diameter changes were in the normal range (>= 50%),   consistent with normal central venous pressure.  Impressions:  - LVEF 55-60%, normal wall thickness, normal wall motion, normal   diastolic function, top normal ascending aorta at 3.9 cm, normal   LA  size, trivial TR, RVSP 26 mmHg, normal IVC.  Cardiac Cath: Coronary Findings   Dominance: Left  Left Main  . Vessel is large.  Left Anterior Descending  First Diagonal Branch  The vessel is moderate in size.  Second Diagonal Branch  The vessel is small in size.  Ost 2nd Diag to 2nd Diag lesion, 100% stenosed.  Angioplasty: Lesion length: 18 mm. Angioplasty alone was performed. Pre-stent angioplasty was not performedMaximum pressure: 14 atm. There is no pre-interventional antegrade distal flow. There is no post-interventional antegrade distal flow. The intervention was successful . No complications occured at this lesion. Angiomax Bolus & gtt; Brilinta 180 mg PO CATH VISTA GUIDE 6FR XBLAD3.5 ([id number]); WIRE INTUITION HYDRO ST. 180CM (INTU180HS)  Supplies used: BALLOON EUPHORA RX 2.0X12; BALLOON EUPHORA RX 2.0X20  There is a 10% residual stenosis post intervention.  Third Diagonal Branch  The vessel is small in size.  Ramus Intermedius  . Vessel is angiographically normal. Several small-moderate branches  Left Circumflex  . Vessel is angiographically normal.  First Obtuse Marginal Branch  The vessel is moderate in size and is angiographically normal.  Third Obtuse Marginal Branch  The vessel is small in size and is angiographically normal.  Fourth Obtuse Marginal  Branch  The vessel is small in size and is angiographically normal.  Right Coronary Artery  . Vessel is small. Vessel is angiographically normal.  Wall Motion              Left Heart   Left Ventricle The left ventricular size is normal. The left ventricular systolic function is normal. The left ventricular ejection fraction is 55-65% by visual estimate. There are no wall motion abnormalities in the left ventricle.    Mitral Valve There is no mitral valve stenosis and no mitral valve regurgitation.    Aortic Valve , and no aortic valve regurgitation. ? Mild - initial pullback did not show gradient, but 2nd attempt did. The aortic valve is calcified.    Coronary Diagrams   Diagnostic Diagram     Post-Intervention Diagram      ASSESSMENT AND PLAN: 1.  CAD, native vessel: Cardiac cath films and report reviewed. The patient underwent plain old balloon angioplasty of the first diagonal branch the LAD. He had no other obstructive disease. Chest pain is highly atypical and likely related to reflux. Continue medical therapy.  2. Hypertension, uncontrolled: stop lisinopril (cough) and start losartan 50 mg daily. Continue metoprolol. Consider add amlodipine at follow-up if BP remains elevated.   3. Hyperlipidemia: on high-intensity statin drug.   4. Tobacco abuse: quit smoking.   5. PAD with intermittent claudication. Symptoms and exam suggestive of right SFA disease. Check ABI's and doppler studies, refer for PV consult.   Current medicines are reviewed with the patient today.  The patient does not have concerns regarding medicines.  Labs/ tests ordered today include:  No orders of the defined types were placed in this encounter.   Disposition:   FU 6 months with APP  Signed, Sherren Mocha, MD  05/09/2016 11:00 AM    [facility] [place], [place], [place]  [address] Phone: [phone]; Fax: [phone]

## 2016-05-09 NOTE — Patient Instructions (Signed)
Medication Instructions:  Your physician has recommended you make the following change in your medication:  1. STOP Lisinopril 2. START Losartan 50mg  take one tablet by mouth daily  Labwork: No new orders.   Testing/Procedures: Your physician has requested that you have a lower extremity arterial duplex with ABI. This test is an ultrasound of the arteries in the legs. It looks at arterial blood flow in the legs. Allow one hour for Lower Arterial scans. There are no restrictions or special instructions  Follow-Up: You have been referred to Dr Kirke Corin for Peripheral Vascular evaluation.   Your physician recommends that you schedule a follow-up appointment in: 6 MONTHS with [name] PA-C  Your physician wants you to follow-up in: 1 YEAR with Dr Excell Seltzer.  You will receive a reminder letter in the mail two months in advance. If you don't receive a letter, please call our office to schedule the follow-up appointment.   Any Other Special Instructions Will Be Listed Below (If Applicable).     If you need a refill on your cardiac medications before your next appointment, please call your pharmacy.

## 2016-05-12 ENCOUNTER — Other Ambulatory Visit: Payer: Self-pay | Admitting: Cardiovascular Disease

## 2016-05-12 DIAGNOSIS — R0989 Other specified symptoms and signs involving the circulatory and respiratory systems: Secondary | ICD-10-CM

## 2016-05-12 DIAGNOSIS — I739 Peripheral vascular disease, unspecified: Secondary | ICD-10-CM

## 2016-05-15 ENCOUNTER — Ambulatory Visit (HOSPITAL_COMMUNITY)
Admission: RE | Admit: 2016-05-15 | Discharge: 2016-05-15 | Disposition: A | Discharge status: Home / Self Care | Payer: BC Managed Care – PPO | Source: Ambulatory Visit | Attending: Cardiology | Admitting: Cardiology

## 2016-05-15 DIAGNOSIS — I70203 Unspecified atherosclerosis of native arteries of extremities, bilateral legs: Secondary | ICD-10-CM | POA: Insufficient documentation

## 2016-05-15 DIAGNOSIS — M79604 Pain in right leg: Secondary | ICD-10-CM

## 2016-05-15 DIAGNOSIS — R0989 Other specified symptoms and signs involving the circulatory and respiratory systems: Secondary | ICD-10-CM

## 2016-05-15 DIAGNOSIS — E785 Hyperlipidemia, unspecified: Secondary | ICD-10-CM | POA: Diagnosis not present

## 2016-05-15 DIAGNOSIS — K219 Gastro-esophageal reflux disease without esophagitis: Secondary | ICD-10-CM | POA: Insufficient documentation

## 2016-05-15 DIAGNOSIS — I10 Essential (primary) hypertension: Secondary | ICD-10-CM | POA: Diagnosis not present

## 2016-05-15 DIAGNOSIS — I2510 Atherosclerotic heart disease of native coronary artery without angina pectoris: Secondary | ICD-10-CM | POA: Diagnosis not present

## 2016-05-15 DIAGNOSIS — R938 Abnormal findings on diagnostic imaging of other specified body structures: Secondary | ICD-10-CM | POA: Insufficient documentation

## 2016-05-15 DIAGNOSIS — I771 Stricture of artery: Secondary | ICD-10-CM | POA: Diagnosis not present

## 2016-05-15 DIAGNOSIS — I743 Embolism and thrombosis of arteries of the lower extremities: Secondary | ICD-10-CM | POA: Insufficient documentation

## 2016-05-15 DIAGNOSIS — I7789 Other specified disorders of arteries and arterioles: Secondary | ICD-10-CM | POA: Insufficient documentation

## 2016-05-15 DIAGNOSIS — I739 Peripheral vascular disease, unspecified: Secondary | ICD-10-CM

## 2016-06-17 ENCOUNTER — Encounter: Payer: Self-pay | Admitting: Cardiovascular Disease

## 2016-06-17 ENCOUNTER — Ambulatory Visit (INDEPENDENT_AMBULATORY_CARE_PROVIDER_SITE_OTHER): Payer: BC Managed Care – PPO | Admitting: Cardiovascular Disease

## 2016-06-17 VITALS — BP 142/90 | HR 68 | Ht 70.0 in | Wt 200.6 lb

## 2016-06-17 DIAGNOSIS — E785 Hyperlipidemia, unspecified: Secondary | ICD-10-CM

## 2016-06-17 DIAGNOSIS — I2510 Atherosclerotic heart disease of native coronary artery without angina pectoris: Secondary | ICD-10-CM | POA: Diagnosis not present

## 2016-06-17 DIAGNOSIS — I10 Essential (primary) hypertension: Secondary | ICD-10-CM

## 2016-06-17 DIAGNOSIS — I739 Peripheral vascular disease, unspecified: Secondary | ICD-10-CM | POA: Diagnosis not present

## 2016-06-17 NOTE — Patient Instructions (Signed)
Medication Instructions:  Your physician recommends that you continue on your current medications as directed. Please refer to the Current Medication list given to you today.  Labwork: No new orders.   Testing/Procedures: No new orders.   Follow-Up: Your physician recommends that you schedule a follow-up appointment in: 3 MONTHS with Dr Arida   Any Other Special Instructions Will Be Listed Below (If Applicable).     If you need a refill on your cardiac medications before your next appointment, please call your pharmacy.   

## 2016-06-17 NOTE — Progress Notes (Signed)
Cardiology Office Note   Date:  06/17/2016   ID:  Billy CavaJames A Apo, DOB 1951/02/24, MRN [id number]  PCP:  Dolores Lorylark,Michael Lee, PA-C  Cardiologist:  Dr. [name].   Chief Complaint  Patient presents with  . New Evaluation  . PVD      History of Present Illness: Billy Riley is a 65 y.o. male who Was referred by Dr. [name] for evaluation and management of recently diagnosed peripheral arterial disease. He has known history of coronary artery disease status post non-ST elevation myocardial infarction in April 2017. Cardiac catheterization showed occluded first diagonal branch which was treated with balloon angioplasty. Ejection fraction was normal. He has other chronic medical conditions that include hypertension, hyperlipidemia and tobacco use. The patient complained of right calf exertional pain. He underwent noninvasive vascular evaluation which showed an ABI of 0.69 on the right and normal on the left. Duplex showed significant right SFA and popliteal disease with two-vessel runoff bilaterally. The patient reports right calf claudication for many years. It happens after walking one to 2 blocks and forces him to stop and rest for a few minutes. He has no rest pain or lower extremity ulceration. The patient quit smoking since his myocardial infarction.    Past Medical History:  Diagnosis Date  . Allergy   . Arthritis   . Coronary artery disease    a. 01/2016: NSTEMI w/ 100% stenosis of Ost 2nd Diag --> POBA due to small vessel size, started on DAPT  . GERD (gastroesophageal reflux disease)   . History of blood transfusion 2006   "w/my back OR"  . History of hiatal hernia   . HLD (hyperlipidemia)    a. 01/2016: initiated on statin therapy  . HTN (hypertension)     Past Surgical History:  Procedure Laterality Date  . BACK SURGERY    . CARDIAC CATHETERIZATION  2006  . CARDIAC CATHETERIZATION N/A 01/21/2016   Procedure: Left Heart Cath and Coronary Angiography;  Surgeon:  Marykay Lexavid W Harding, MD;  [facility];  Service: Cardiovascular;  Laterality: N/A;  . CARDIAC CATHETERIZATION N/A 01/21/2016   Procedure: Coronary Stent Intervention;  Surgeon: Marykay Lexavid W Harding, MD;  [facility];  Service: Cardiovascular;  Laterality: N/A;  . CORONARY ANGIOPLASTY  01/21/2016  . POSTERIOR LUMBAR FUSION  2006   "L4-5"     Current Outpatient Prescriptions  Medication Sig Dispense Refill  . acetaminophen (TYLENOL) 500 MG tablet Take 500 mg by mouth every 6 (six) hours as needed for mild pain or headache.    Marland Kitchen. aspirin EC 81 MG EC tablet Take 1 tablet (81 mg total) by mouth daily.    Marland Kitchen. atorvastatin (LIPITOR) 80 MG tablet Take 1 tablet (80 mg total) by mouth daily at 6 PM. 30 tablet 6  . losartan (COZAAR) 50 MG tablet Take 1 tablet (50 mg total) by mouth daily. 90 tablet 3  . metoprolol tartrate (LOPRESSOR) 25 MG tablet Take 1 tablet (25 mg total) by mouth 2 (two) times daily. 60 tablet 6  . nitroGLYCERIN (NITROSTAT) 0.4 MG SL tablet Place 1 tablet (0.4 mg total) under the tongue every 5 (five) minutes as needed for chest pain. 25 tablet 3  . ranitidine (ZANTAC) 150 MG tablet Take 150 mg by mouth 2 (two) times daily.    . ticagrelor (BRILINTA) 90 MG TABS tablet Take 1 tablet (90 mg total) by mouth 2 (two) times daily. 180 tablet 3   No current facility-administered medications for this  visit.     Allergies:   Asa [aspirin] and Lisinopril    Social History:  The patient  reports that he has quit smoking. His smoking use included Cigarettes. He has a 5.76 pack-year smoking history. He has never used smokeless tobacco. He reports that he does not drink alcohol or use drugs.   Family History:  The patient's family history includes Cancer in his mother and sister; Diabetes in his sister; Heart disease in his brother and father.    ROS:  Please see the history of present illness.   Otherwise, review of systems are positive for none.   All other systems  are reviewed and negative.    PHYSICAL EXAM: VS:  BP (!) 142/90 (BP Location: Right Arm, Patient Position: Sitting, Cuff Size: Normal)   Pulse 68   Ht 5\' 10"  (1.778 m)   Wt 200 lb 9.6 oz (91 kg)   SpO2 99%   BMI 28.78 kg/m  , BMI Body mass index is 28.78 kg/m. GEN: Well nourished, well developed, in no acute distress  HEENT: normal  Neck: no JVD, carotid bruits, or masses Cardiac: RRR; no murmurs, rubs, or gallops,no edema  Respiratory:  clear to auscultation bilaterally, normal work of breathing GI: soft, nontender, nondistended, + BS MS: no deformity or atrophy  Skin: warm and dry, no rash Neuro:  Strength and sensation are intact Psych: euthymic mood, full affect Vascular: Femoral pulses are normal bilaterally. Dorsalis pedis is not palpable on both sides. Posterior tibial: +2 on the left side and +1 on the right side  EKG:  EKG is not ordered today.    Recent Labs: 01/21/2016: ALT 18; Hemoglobin 13.5; Platelets 195; TSH 0.998 01/22/2016: BUN 7; Creatinine, Ser 1.07; Potassium 3.8; Sodium 140    Lipid Panel    Component Value Date/Time   CHOL 190 01/21/2016 0617   TRIG 89 01/21/2016 0617   HDL 39 (L) 01/21/2016 0617   CHOLHDL 4.9 01/21/2016 0617   VLDL 18 01/21/2016 0617   LDLCALC 133 (H) 01/21/2016 0617      Wt Readings from Last 3 Encounters:  06/17/16 200 lb 9.6 oz (91 kg)  05/09/16 198 lb (89.8 kg)  02/01/16 192 lb (87.1 kg)       No flowsheet data found.    ASSESSMENT AND PLAN:  1.  Peripheral arterial disease: Severe right calf claudication (Rutherford class III) due to SFA and popliteal disease. I discussed with him the natural history and management of claudication. His symptoms are currently severe lifestyle limiting. I discussed with him the option of proceeding with angiography and possible endovascular intervention. However, he wants to try conservative management initially. I discussed with him and daily walking program and he is going to do  that for a few months and then return for reevaluation. He is already on dual antiplatelet therapy and optimal treatment of risk factors.  2. Coronary artery disease involving native coronary arteries without angina: He is overall doing well.  3. Hyperlipidemia: Continue treatment with atorvastatin with a target LDL of less than 70.  4. History of tobacco use: The patient quit smoking after his myocardial infarction.    Disposition:   FU with me in 3 months  Signed,  Lorine Bears, MD  06/17/2016 12:02 PM    [facility]

## 2016-08-05 ENCOUNTER — Emergency Department (HOSPITAL_COMMUNITY): Payer: BC Managed Care – PPO

## 2016-08-05 ENCOUNTER — Encounter (HOSPITAL_COMMUNITY): Payer: Self-pay | Admitting: Emergency Medicine

## 2016-08-05 ENCOUNTER — Telehealth: Payer: Self-pay | Admitting: Neurology

## 2016-08-05 ENCOUNTER — Emergency Department (HOSPITAL_COMMUNITY)
Admission: EM | Admit: 2016-08-05 | Discharge: 2016-08-05 | Disposition: A | Discharge status: Home / Self Care | Payer: BC Managed Care – PPO | Attending: Emergency Medicine | Admitting: Emergency Medicine

## 2016-08-05 DIAGNOSIS — I10 Essential (primary) hypertension: Secondary | ICD-10-CM | POA: Diagnosis not present

## 2016-08-05 DIAGNOSIS — I2510 Atherosclerotic heart disease of native coronary artery without angina pectoris: Secondary | ICD-10-CM | POA: Diagnosis not present

## 2016-08-05 DIAGNOSIS — Z87891 Personal history of nicotine dependence: Secondary | ICD-10-CM | POA: Diagnosis not present

## 2016-08-05 DIAGNOSIS — I252 Old myocardial infarction: Secondary | ICD-10-CM | POA: Diagnosis not present

## 2016-08-05 DIAGNOSIS — Z7982 Long term (current) use of aspirin: Secondary | ICD-10-CM | POA: Insufficient documentation

## 2016-08-05 DIAGNOSIS — Z955 Presence of coronary angioplasty implant and graft: Secondary | ICD-10-CM | POA: Insufficient documentation

## 2016-08-05 DIAGNOSIS — R079 Chest pain, unspecified: Secondary | ICD-10-CM | POA: Diagnosis present

## 2016-08-05 DIAGNOSIS — R0789 Other chest pain: Secondary | ICD-10-CM | POA: Insufficient documentation

## 2016-08-05 LAB — CBC
HEMATOCRIT: 38.7 % — AB (ref 39.0–52.0)
Hemoglobin: 13.4 g/dL (ref 13.0–17.0)
MCH: 33.3 pg (ref 26.0–34.0)
MCHC: 34.6 g/dL (ref 30.0–36.0)
MCV: 96 fL (ref 78.0–100.0)
Platelets: 224 10*3/uL (ref 150–400)
RBC: 4.03 MIL/uL — ABNORMAL LOW (ref 4.22–5.81)
RDW: 14.4 % (ref 11.5–15.5)
WBC: 8.1 10*3/uL (ref 4.0–10.5)

## 2016-08-05 LAB — I-STAT TROPONIN, ED
Troponin i, poc: 0 ng/mL (ref 0.00–0.08)
Troponin i, poc: 0 ng/mL (ref 0.00–0.08)

## 2016-08-05 LAB — BASIC METABOLIC PANEL
Anion gap: 7 (ref 5–15)
BUN: 10 mg/dL (ref 6–20)
CHLORIDE: 110 mmol/L (ref 101–111)
CO2: 23 mmol/L (ref 22–32)
Calcium: 9.5 mg/dL (ref 8.9–10.3)
Creatinine, Ser: 1.17 mg/dL (ref 0.61–1.24)
GFR calc Af Amer: >60 mL/min (ref >60)
GFR calc non Af Amer: >60 mL/min (ref >60)
GLUCOSE: 117 mg/dL — AB (ref 65–99)
POTASSIUM: 3.8 mmol/L (ref 3.5–5.1)
Sodium: 140 mmol/L (ref 135–145)

## 2016-08-05 MED ORDER — FAMOTIDINE IN NACL 20-0.9 MG/50ML-% IV SOLN
20.0000 mg | Freq: Once | INTRAVENOUS | Status: AC
  Administered 2016-08-05: 20 mg via INTRAVENOUS
  Filled 2016-08-05: qty 50

## 2016-08-05 MED ORDER — GI COCKTAIL ~~LOC~~
30.0000 mL | Freq: Once | ORAL | Status: AC
  Administered 2016-08-05: 30 mL via ORAL
  Filled 2016-08-05: qty 30

## 2016-08-05 NOTE — ED Notes (Signed)
Pt ambulated to bathroom, no problems.

## 2016-08-05 NOTE — ED Provider Notes (Signed)
[facility]-EM[facility] DEPT Provider Note   CSN: [id number] Arrival date & time: 08/05/16  1547     History   Chief Complaint Chief Complaint  Patient presents with  . Chest Pain    HPI Billy Riley is a 65 year old man with history of CAD s/p balloon angioplasty in 01/2016, GERD, HLD, HTN.  HPI He presents with chest pain. Started around 1:30pm. Located under left breast. Pain was sharp. It was constant. No radiation. He had some tingling in his left arm. He thinks he was sitting at the time it started. No diaphoresis or nausea. No exacerbating factors. Improves with burping. It has improved greatly but not completely gone. He has not tried any medications. Does not feel similar to NSTEMI in April. He has acid reflux. He reports this feels like his acid reflux. He thinks that this was triggered by cheese.   No chest pain or shortness of breath with exertion. No orthopnea or PND. He reports compliance of his ASA and Brillinta.   Past Medical History:  Diagnosis Date  . Allergy   . Arthritis   . Coronary artery disease    a. 01/2016: NSTEMI w/ 100% stenosis of Ost 2nd Diag --> POBA due to small vessel size, started on DAPT  . GERD (gastroesophageal reflux disease)   . History of blood transfusion 2006   "w/my back OR"  . History of hiatal hernia   . HLD (hyperlipidemia)    a. 01/2016: initiated on statin therapy  . HTN (hypertension)     Patient Active Problem List   Diagnosis Date Noted  . Systolic murmur of aorta 01/21/2016  . Hypertensive urgency   . Ischemic chest pain (HCC)   . NSTEMI (non-ST elevated myocardial infarction) (HCC)   . Tobacco dependence   . DDD (degenerative disc disease), lumbar 10/13/2012  . Tobacco user 10/13/2012    Past Surgical History:  Procedure Laterality Date  . BACK SURGERY    . CARDIAC CATHETERIZATION  2006  . CARDIAC CATHETERIZATION N/A 01/21/2016   Procedure: Left Heart Cath and Coronary Angiography;  Surgeon: Marykay Lex, MD;   [facility] CV [facility];  Service: Cardiovascular;  Laterality: N/A;  . CARDIAC CATHETERIZATION N/A 01/21/2016   Procedure: Coronary Stent Intervention;  Surgeon: Marykay Lex, MD;  Location: [facility] INVASIVE CV LAB;  Service: Cardiovascular;  Laterality: N/A;  . CORONARY ANGIOPLASTY  01/21/2016  . POSTERIOR LUMBAR FUSION  2006   "L4-5"       Home Medications    Prior to Admission medications   Medication Sig Start Date End Date Taking? Authorizing Provider  acetaminophen (TYLENOL) 500 MG tablet Take 500 mg by mouth every 6 (six) hours as needed for mild pain or headache.   Yes Historical Provider, MD  aspirin EC 81 MG EC tablet Take 1 tablet (81 mg total) by mouth daily. 01/22/16  Yes Ellsworth Lennox, PA  atorvastatin (LIPITOR) 80 MG tablet Take 1 tablet (80 mg total) by mouth daily at 6 PM. 01/22/16  Yes Ellsworth Lennox, PA  losartan (COZAAR) 50 MG tablet Take 1 tablet (50 mg total) by mouth daily. 05/09/16 08/07/16 Yes Tonny Bollman, MD  metoprolol tartrate (LOPRESSOR) 25 MG tablet Take 1 tablet (25 mg total) by mouth 2 (two) times daily. 01/22/16  Yes Ellsworth Lennox, PA  nitroGLYCERIN (NITROSTAT) 0.4 MG SL tablet Place 1 tablet (0.4 mg total) under the tongue every 5 (five) minutes as needed for chest pain. 01/22/16  Yes Ellsworth Lennox,  PA  ticagrelor (BRILINTA) 90 MG TABS tablet Take 1 tablet (90 mg total) by mouth 2 (two) times daily. 01/22/16  Yes Ellsworth LennoxBrittany M Strader, PA    Family History Family History  Problem Relation Age of Onset  . Cancer Mother     breast  . Heart disease Father     heart attack  . Cancer Sister     breast  . Heart disease Brother     heart attack  . Diabetes Sister     Social History Social History  Substance Use Topics  . Smoking status: Former Smoker    Packs/day: 0.12    Years: 48.00    Types: Cigarettes  . Smokeless tobacco: Never Used  . Alcohol use No     Allergies   Asa [aspirin] and Lisinopril   Review of  Systems Review of Systems Constitutional: no fevers/chills Eyes: no vision changes Ears, nose, mouth, throat, and face: no cough Respiratory: no shortness of breath Cardiovascular: +chest pain Gastrointestinal: no nausea/vomiting, no abdominal pain, no constipation, no diarrhea Genitourinary: no dysuria, no hematuria Integument: no rash Hematologic/lymphatic: no bleeding/bruising, no edema Musculoskeletal: no arthralgias, no myalgias Neurological: no paresthesias, no weakness   Physical Exam Updated Vital Signs BP 142/92   Pulse 64   Temp 98.3 F (36.8 C) (Oral)   Resp 11   Ht 5\' 10"  (1.778 m)   Wt 93 kg   SpO2 100%   BMI 29.41 kg/m   Physical Exam General Apperance: NAD Head: Normocephalic, atraumatic Eyes: PERRL, EOMI, anicteric sclera Ears: Normal external ear canal Nose: Nares normal, septum midline, mucosa normal Throat: Lips, mucosa and tongue normal  Neck: Supple, trachea midline Back: No tenderness or bony abnormality  Lungs: Clear to auscultation bilaterally. No wheezes, rhonchi or rales. Breathing comfortably Chest Wall: Nontender, no deformity Heart: Regular rate and rhythm Abdomen: Soft, nontender, nondistended, no rebound/guarding Extremities: Normal, atraumatic, warm and well perfused, no edema Pulses: 2+ throughout Skin: No rashes or lesions Neurologic: Alert and oriented x 3. CNII-XII intact. Normal strength and sensation  ED Treatments / Results  Labs (all labs ordered are listed, but only abnormal results are displayed) Labs Reviewed  BASIC METABOLIC PANEL - Abnormal; Notable for the following:       Result Value   Glucose, Bld 117 (*)    All other components within normal limits  CBC - Abnormal; Notable for the following:    RBC 4.03 (*)    HCT 38.7 (*)    All other components within normal limits  I-STAT TROPOININ, ED  I-STAT TROPOININ, ED    EKG  EKG Interpretation  Date/Time:  Tuesday August 05 2016 15:51:09 EDT Ventricular  Rate:  72 PR Interval:  142 QRS Duration: 84 QT Interval:  364 QTC Calculation: 398 R Axis:   25 Text Interpretation:  Normal sinus rhythm Normal ECG Confirmed by BEATON  MD, ROBERT ([id number]) on 08/05/2016 7:23:55 PM       Radiology Dg Chest 2 View  Result Date: 08/05/2016 CLINICAL DATA:  Left chest pain and arm tingling. EXAM: CHEST  2 VIEW COMPARISON:  None. FINDINGS: The heart size and mediastinal contours are within normal limits. Both lungs are clear. The visualized skeletal structures are unremarkable. IMPRESSION: No active cardiopulmonary disease. Electronically Signed   By: Deatra RobinsonKevin  Herman M.D.   On: 08/05/2016 16:12   Procedures   Medications Ordered in ED Medications  famotidine (PEPCID) IVPB 20 mg premix (20 mg Intravenous New Bag/Given 08/05/16 2003)  gi cocktail (  Maalox,Lidocaine,Donnatal) (30 mLs Oral Given 08/05/16 1843)   Initial Impression / Assessment and Plan / ED Course  I have reviewed the triage vital signs and the nursing notes.  Pertinent labs & imaging results that were available during my care of the patient were reviewed by me and considered in my medical decision making (see chart for details).  Clinical Course    65 year old man with history of CAD s/p balloon angioplasty in 01/2016, GERD, HLD, HTN presented with left chest pain. No associated diaphoresis or nausea. BMP unremarkable. CBC with no leukocytosis. Troponin negative x 2. EKG with no acute ischemic changes. Chest pain resolved with GI cocktail.   Final Clinical Impressions(s) / ED Diagnoses   Final diagnoses:  Atypical chest pain  Chest pain probably related to GERD. Discussed with cardiology who agree with plan. Will discharge patient with return precautions and follow up with PCP. May consider OTC GERD medication.   New Prescriptions New Prescriptions   No medications on file     Lora PaulaJennifer T Christophor Eick, MD 08/05/16 2015    Lora PaulaJennifer T Rebecah Dangerfield, MD 08/22/16 1300    Nelva Nayobert Beaton,  MD 09/03/16 [phone]

## 2016-08-05 NOTE — Telephone Encounter (Signed)
Started having chest pain x 2 hours ago.  Chest pain off and on. Left arm feeling feels funny.

## 2016-08-05 NOTE — Telephone Encounter (Signed)
Spoke with pt. He reports chest pain for last 2 hours. Reports this is new for him.  I instructed him to call 911.  He does not want to call EMS but will go to hospital.

## 2016-08-05 NOTE — ED Triage Notes (Signed)
Pt states he had a sudden onset of CP at 2pm today along with left arm tingling. Denies N/V/SOB.

## 2016-08-05 NOTE — Telephone Encounter (Signed)
   THe patient was seen in the ER for chest discomfort.  Sx resolved with a GI cocktail and he had two negative troponins.  He was discharged from the ER.  FYI.  Corky CraftsJayadeep S April Colter, MD

## 2016-08-05 NOTE — Discharge Instructions (Signed)
Keep taking medications as prescribed. You may consider starting a daily reflux medication such as Pepcid or Zantac. Follow up with your primary care provider.

## 2016-08-07 MED ORDER — PANTOPRAZOLE SODIUM 40 MG PO TBEC: 40.0000 mg | DELAYED_RELEASE_TABLET | Freq: Every day | ORAL | 3 refills | Status: DC

## 2016-08-07 NOTE — Telephone Encounter (Signed)
Thanks Vonna KotykJay. Lauren could you call in Protonix 40 mg daily for him?

## 2016-08-07 NOTE — Telephone Encounter (Signed)
I spoke with the pt and he is feeling better since his ER visit.  The pt has not started an OTC reflux medication as advised by the ER.  I made him aware that we will send in a Rx for Pantoprazole 40mg  daily to be taken 30 minutes prior to breakfast.  Pt agreed with plan. The pt will contact the office with any further questions or concerns.

## 2016-08-23 ENCOUNTER — Other Ambulatory Visit: Payer: Self-pay | Admitting: Student

## 2016-09-09 ENCOUNTER — Ambulatory Visit (INDEPENDENT_AMBULATORY_CARE_PROVIDER_SITE_OTHER): Payer: BC Managed Care – PPO | Admitting: Family Medicine

## 2016-09-09 VITALS — BP 160/80 | HR 62 | Temp 98.1°F | Resp 17 | Ht 69.0 in | Wt 195.0 lb

## 2016-09-09 DIAGNOSIS — I25810 Atherosclerosis of coronary artery bypass graft(s) without angina pectoris: Secondary | ICD-10-CM

## 2016-09-09 DIAGNOSIS — R399 Unspecified symptoms and signs involving the genitourinary system: Secondary | ICD-10-CM | POA: Diagnosis not present

## 2016-09-09 DIAGNOSIS — N400 Enlarged prostate without lower urinary tract symptoms: Secondary | ICD-10-CM

## 2016-09-09 DIAGNOSIS — I10 Essential (primary) hypertension: Secondary | ICD-10-CM

## 2016-09-09 DIAGNOSIS — R310 Gross hematuria: Secondary | ICD-10-CM

## 2016-09-09 DIAGNOSIS — R319 Hematuria, unspecified: Secondary | ICD-10-CM | POA: Diagnosis not present

## 2016-09-09 LAB — POCT URINALYSIS DIP (MANUAL ENTRY)
Bilirubin, UA: NEGATIVE
Glucose, UA: NEGATIVE
Ketones, POC UA: NEGATIVE
Nitrite, UA: NEGATIVE
PROTEIN UA: NEGATIVE
SPEC GRAV UA: 1.01
UROBILINOGEN UA: 0.2
pH, UA: 5.5

## 2016-09-09 LAB — POC MICROSCOPIC URINALYSIS (UMFC): MUCUS RE: ABSENT

## 2016-09-09 NOTE — Progress Notes (Signed)
Chief Complaint  Patient presents with  . Hematuria    Onset this am    HPI  Gross hematuria and LUTS Pt reports that he started to have a blood urination at 9:30am The urine seemed like it was coming out like little drips but bloody He had burning pain He states that he noticed that he had the urge to urinate and it was coming out in drips but today when he gave his urine sample his urine cleared up and he was able to have his normal flow. He states that he was diagnosed with enlarged prostate since age 65. He reports that the dysuria resolved.    Hypertension and CAD Reports that his bp ranges 145/85 and seems to be higher at the doctors office He had a NSTEMI in April 2017 and follows with Cardiology Dr. [name] Denies chest pain, shortness of breath or palpitation No nose bleeds He took all his meds today   Past Medical History:  Diagnosis Date  . Allergy   . Arthritis   . Coronary artery disease    a. 01/2016: NSTEMI w/ 100% stenosis of Ost 2nd Diag --> POBA due to small vessel size, started on DAPT  . GERD (gastroesophageal reflux disease)   . History of blood transfusion 2006   "w/my back OR"  . History of hiatal hernia   . HLD (hyperlipidemia)    a. 01/2016: initiated on statin therapy  . HTN (hypertension)     Current Outpatient Prescriptions  Medication Sig Dispense Refill  . acetaminophen (TYLENOL) 500 MG tablet Take 500 mg by mouth every 6 (six) hours as needed for mild pain or headache.    Marland Kitchen. aspirin EC 81 MG EC tablet Take 1 tablet (81 mg total) by mouth daily.    Marland Kitchen. atorvastatin (LIPITOR) 80 MG tablet Take 1 tablet (80 mg total) by mouth daily at 6 PM. 30 tablet 6  . metoprolol tartrate (LOPRESSOR) 25 MG tablet TAKE ONE TABLET BY MOUTH TWICE DAILY 60 tablet 9  . nitroGLYCERIN (NITROSTAT) 0.4 MG SL tablet Place 1 tablet (0.4 mg total) under the tongue every 5 (five) minutes as needed for chest pain. 25 tablet 3  . pantoprazole (PROTONIX) 40 MG tablet Take 1  tablet (40 mg total) by mouth daily. 90 tablet 3  . ticagrelor (BRILINTA) 90 MG TABS tablet Take 1 tablet (90 mg total) by mouth 2 (two) times daily. 180 tablet 3  . losartan (COZAAR) 50 MG tablet Take 1 tablet (50 mg total) by mouth daily. 90 tablet 3   No current facility-administered medications for this visit.     Allergies:  Allergies  Allergen Reactions  . Asa [Aspirin]     Burns stomach  . Lisinopril Cough    Past Surgical History:  Procedure Laterality Date  . BACK SURGERY    . CARDIAC CATHETERIZATION  2006  . CARDIAC CATHETERIZATION N/A 01/21/2016   Procedure: Left Heart Cath and Coronary Angiography;  Surgeon: Marykay Lexavid W Harding, MD;  [facility];  Service: Cardiovascular;  Laterality: N/A;  . CARDIAC CATHETERIZATION N/A 01/21/2016   Procedure: Coronary Stent Intervention;  Surgeon: Marykay Lexavid W Harding, MD;  [facility];  Service: Cardiovascular;  Laterality: N/A;  . CORONARY ANGIOPLASTY  01/21/2016  . POSTERIOR LUMBAR FUSION  2006   "L4-5"    Social History   Social History  . Marital status: Married    Spouse name: N/A  . Number of children: N/A  . Years of  education: N/A   Social History Main Topics  . Smoking status: Former Smoker    Packs/day: 0.12    Years: 48.00    Types: Cigarettes  . Smokeless tobacco: Never Used  . Alcohol use No  . Drug use: No  . Sexual activity: Yes   Other Topics Concern  . None   Social History Narrative  . None    Review of Systems  Constitutional: Negative for chills, fever and malaise/fatigue.  Gastrointestinal: Positive for abdominal pain. Negative for nausea and vomiting.       Lower abdominal pressure   Genitourinary: Positive for dysuria, frequency, hematuria and urgency. Negative for flank pain.    Objective: Vitals:   09/09/16 1137  BP: (!) 160/80  Pulse: 62  Resp: 17  Temp: 98.1 F (36.7 C)  TempSrc: Oral  SpO2: 99%  Weight: 195 lb (88.5 kg)  Height: 5\' 9"  (1.753 m)    BP Readings from Last 3 Encounters:  09/09/16 (!) 160/80  08/05/16 152/90  06/17/16 (!) 142/90    Physical Exam  Constitutional: He is oriented to person, place, and time. He appears well-developed and well-nourished.  HENT:  Head: Normocephalic and atraumatic.  Right Ear: External ear normal.  Left Ear: External ear normal.  Eyes: Conjunctivae and EOM are normal.  Cardiovascular: Normal rate, regular rhythm, normal heart sounds and intact distal pulses.   No murmur heard. Pulmonary/Chest: Effort normal and breath sounds normal. No respiratory distress. He has no wheezes.  Genitourinary: Prostate is enlarged.  Genitourinary Comments: nontender prostate Prostate enlarged making rectal exam difficult due to the pressure from the enlarged prostate  Neurological: He is alert and oriented to person, place, and time.     Assessment and Plan Billy Riley was seen today for hematuria.  Diagnoses and all orders for this visit:  Hematuria, unspecified type -     POCT Microscopic Urinalysis ([facility]) -     POCT urinalysis dipstick -     Urine culture -     PSA  Lower urinary tract symptoms (LUTS)- will send urine culture  -     Urine culture -     PSA -     Ambulatory referral to Urology  Gross hematuria- could be from renal stone or from aspirin and brilinta Considering his recent NSTEMI would not stop either medication at this point since there is more risk from stopping his anticoagulation than from a microscopic hematuria from a stone Urine cleared up but there was still persistent hematuria on the exam  -     Ambulatory referral to Urology  Coronary artery disease involving coronary bypass graft of native heart without angina pectoris  Uncontrolled hypertension- blood pressure fluctuates since pt had MI Would like to see more consistent bp control Has follow up with Cardiology this month Continue to monitor bp  Enlarged prostate- enlarged and causing intermittent urinary  retention Not sure why he has hematuria though -     Ambulatory referral to Urology   A total of 40 minutes were spent face-to-face with the patient during this encounter and over half of that time was spent on counseling and coordination of care.   Maryanne Huneycutt A Kameko Hukill

## 2016-09-09 NOTE — Patient Instructions (Addendum)
     IF you received an x-ray today, you will receive an invoice from [facility]. Please contact [facility] at [phone] with questions or concerns regarding your invoice.   IF you received labwork today, you will receive an invoice from [facility]. Please contact [facility] at [phone] with questions or concerns regarding your invoice.   Our billing staff will not be able to assist you with questions regarding bills from these companies.  You will be contacted with the lab results as soon as they are available. The fastest way to get your results is to activate your My Chart account. Instructions are located on the last page of this paperwork. If you have not heard from us regarding the results in 2 weeks, please contact this office.     Acute Urinary Retention, Male Acute urinary retention is the temporary inability to urinate. This is a common problem in older men. As men age their prostates become larger and block the flow of urine from the bladder. This is usually a problem that has come on gradually. Follow these instructions at home: If you are sent home with a Foley catheter and a drainage system, you will need to discuss the best course of action with your health care provider. While the catheter is in, maintain a good intake of fluids. Keep the drainage bag emptied and lower than your catheter. This is so that contaminated urine will not flow back into your bladder, which could lead to a urinary tract infection. There are two main types of drainage bags. One is a large bag that usually is used at night. It has a good capacity that will allow you to sleep through the night without having to empty it. The second type is called a leg bag. It has a smaller capacity, so it needs to be emptied more frequently. However, the main advantage is that it can be attached by a leg strap and can go underneath your clothing, allowing you the freedom to  move about or leave your home. Only take over-the-counter or prescription medicines for pain, discomfort, or fever as directed by your health care provider. Contact a health care provider if:  You develop a low-grade fever.  You experience spasms or leakage of urine with the spasms. Get help right away if:  You develop chills or fever.  Your catheter stops draining urine.  Your catheter falls out.  You start to develop increased bleeding that does not respond to rest and increased fluid intake. This information is not intended to replace advice given to you by your health care provider. Make sure you discuss any questions you have with your health care provider. Document Released: 12/29/2000 Document Revised: 03/05/2016 Document Reviewed: 03/03/2013 Elsevier Interactive Patient Education  2017 [employer]

## 2016-09-10 ENCOUNTER — Encounter: Payer: Self-pay | Admitting: Family Medicine

## 2016-09-10 LAB — PSA: Prostate Specific Ag, Serum: 0.5 ng/mL (ref 0.0–4.0)

## 2016-09-12 ENCOUNTER — Other Ambulatory Visit: Payer: Self-pay | Admitting: Family Medicine

## 2016-09-12 LAB — URINE CULTURE

## 2016-09-12 MED ORDER — CIPROFLOXACIN HCL 250 MG PO TABS: 250.0000 mg | ORAL_TABLET | Freq: Two times a day (BID) | ORAL | 0 refills | Status: DC

## 2016-09-12 NOTE — Progress Notes (Signed)
Spoke to pt. Sent in cipro.

## 2016-09-16 ENCOUNTER — Encounter: Payer: Self-pay | Admitting: Cardiovascular Disease

## 2016-09-16 ENCOUNTER — Other Ambulatory Visit: Payer: Self-pay

## 2016-09-16 ENCOUNTER — Ambulatory Visit (INDEPENDENT_AMBULATORY_CARE_PROVIDER_SITE_OTHER): Payer: BC Managed Care – PPO | Admitting: Cardiovascular Disease

## 2016-09-16 VITALS — BP 160/100 | HR 88 | Wt 193.0 lb

## 2016-09-16 DIAGNOSIS — E7849 Other hyperlipidemia: Secondary | ICD-10-CM

## 2016-09-16 DIAGNOSIS — E784 Other hyperlipidemia: Secondary | ICD-10-CM | POA: Diagnosis not present

## 2016-09-16 DIAGNOSIS — I10 Essential (primary) hypertension: Secondary | ICD-10-CM | POA: Diagnosis not present

## 2016-09-16 DIAGNOSIS — I2510 Atherosclerotic heart disease of native coronary artery without angina pectoris: Secondary | ICD-10-CM | POA: Diagnosis not present

## 2016-09-16 DIAGNOSIS — I739 Peripheral vascular disease, unspecified: Secondary | ICD-10-CM

## 2016-09-16 MED ORDER — ATORVASTATIN CALCIUM 80 MG PO TABS: 80.0000 mg | ORAL_TABLET | Freq: Every day | ORAL | 3 refills | Status: DC

## 2016-09-16 MED ORDER — METOPROLOL TARTRATE 25 MG PO TABS
25.0000 mg | ORAL_TABLET | Freq: Two times a day (BID) | ORAL | 3 refills | Status: DC
Start: 2016-09-16 — End: 2017-03-19

## 2016-09-16 MED ORDER — LOSARTAN POTASSIUM 50 MG PO TABS: 50.0000 mg | ORAL_TABLET | Freq: Every day | ORAL | 3 refills | Status: DC

## 2016-09-16 NOTE — Patient Instructions (Signed)

## 2016-09-16 NOTE — Progress Notes (Signed)
Cardiology Office Note   Date:  09/16/2016   ID:  Billy CavaJames A Roediger, DOB 02/26/1951, MRN [id number]  PCP:  Doristine BosworthZoe A Stallings, MD  Cardiologist:  Dr. [name].   No chief complaint on file.     History of Present Illness: Billy Riley is a 65 y.o. male who Is here today for a follow-up visit regarding peripheral arterial disease. He has known history of coronary artery disease status post non-ST elevation myocardial infarction in April 2017. Cardiac catheterization showed occluded first diagonal branch which was treated with balloon angioplasty. Ejection fraction was normal. He has other chronic medical conditions that include hypertension, hyperlipidemia and tobacco use.He was seen for right calf claudication.  He underwent noninvasive vascular evaluation which showed an ABI of 0.69 on the right and normal on the left. Duplex showed significant right SFA and popliteal disease with two-vessel runoff bilaterally. The patient did not feel limited by claudication he was treated medically. He reports that his calf pain currently is very mild. He ran out of atorvastatin, losartan and metoprolol. According to him, he had no refills at the pharmacy. He denies any chest pain or shortness of breath. He had recent hematuria that resolved after he was treated with antibiotics.   Past Medical History:  Diagnosis Date  . Allergy   . Arthritis   . Coronary artery disease    a. 01/2016: NSTEMI w/ 100% stenosis of Ost 2nd Diag --> POBA due to small vessel size, started on DAPT  . GERD (gastroesophageal reflux disease)   . History of blood transfusion 2006   "w/my back OR"  . History of hiatal hernia   . HLD (hyperlipidemia)    a. 01/2016: initiated on statin therapy  . HTN (hypertension)     Past Surgical History:  Procedure Laterality Date  . BACK SURGERY    . CARDIAC CATHETERIZATION  2006  . CARDIAC CATHETERIZATION N/A 01/21/2016   Procedure: Left Heart Cath and Coronary Angiography;   Surgeon: Marykay Lexavid W Harding, MD;  [facility];  Service: Cardiovascular;  Laterality: N/A;  . CARDIAC CATHETERIZATION N/A 01/21/2016   Procedure: Coronary Stent Intervention;  Surgeon: Marykay Lexavid W Harding, MD;  [facility];  Service: Cardiovascular;  Laterality: N/A;  . CORONARY ANGIOPLASTY  01/21/2016  . POSTERIOR LUMBAR FUSION  2006   "L4-5"     Current Outpatient Prescriptions  Medication Sig Dispense Refill  . acetaminophen (TYLENOL) 500 MG tablet Take 500 mg by mouth every 6 (six) hours as needed for mild pain or headache.    Marland Kitchen. aspirin EC 81 MG EC tablet Take 1 tablet (81 mg total) by mouth daily.    Marland Kitchen. atorvastatin (LIPITOR) 80 MG tablet Take 1 tablet (80 mg total) by mouth daily at 6 PM. 90 tablet 3  . ciprofloxacin (CIPRO) 250 MG tablet Take 1 tablet (250 mg total) by mouth 2 (two) times daily. 6 tablet 0  . metoprolol tartrate (LOPRESSOR) 25 MG tablet Take 1 tablet (25 mg total) by mouth 2 (two) times daily. 180 tablet 3  . nitroGLYCERIN (NITROSTAT) 0.4 MG SL tablet Place 1 tablet (0.4 mg total) under the tongue every 5 (five) minutes as needed for chest pain. 25 tablet 3  . pantoprazole (PROTONIX) 40 MG tablet Take 1 tablet (40 mg total) by mouth daily. 90 tablet 3  . ticagrelor (BRILINTA) 90 MG TABS tablet Take 1 tablet (90 mg total) by mouth 2 (two) times daily. 180 tablet 3  .  losartan (COZAAR) 50 MG tablet Take 1 tablet (50 mg total) by mouth daily. 90 tablet 3   No current facility-administered medications for this visit.     Allergies:   Asa [aspirin] and Lisinopril    Social History:  The patient  reports that he has quit smoking. His smoking use included Cigarettes. He has a 5.76 pack-year smoking history. He has never used smokeless tobacco. He reports that he does not drink alcohol or use drugs.   Family History:  The patient's family history includes Cancer in his mother and sister; Diabetes in his sister; Heart disease in his brother and  father.    ROS:  Please see the history of present illness.   Otherwise, review of systems are positive for none.   All other systems are reviewed and negative.    PHYSICAL EXAM: VS:  BP (!) 160/100   Pulse 88   Wt 193 lb (87.5 kg)   SpO2 98%   BMI 28.50 kg/m  , BMI Body mass index is 28.5 kg/m. GEN: Well nourished, well developed, in no acute distress  HEENT: normal  Neck: no JVD, carotid bruits, or masses Cardiac: RRR; no murmurs, rubs, or gallops,no edema  Respiratory:  clear to auscultation bilaterally, normal work of breathing GI: soft, nontender, nondistended, + BS MS: no deformity or atrophy  Skin: warm and dry, no rash Neuro:  Strength and sensation are intact Psych: euthymic mood, full affect Vascular: Femoral pulses are normal bilaterally. Dorsalis pedis is not palpable on both sides. Posterior tibial: +2 on the left side and +1 on the right side  EKG:  EKG is not ordered today.    Recent Labs: 01/21/2016: ALT 18; TSH 0.998 08/05/2016: BUN 10; Creatinine, Ser 1.17; Hemoglobin 13.4; Platelets 224; Potassium 3.8; Sodium 140    Lipid Panel    Component Value Date/Time   CHOL 190 01/21/2016 0617   TRIG 89 01/21/2016 0617   HDL 39 (L) 01/21/2016 0617   CHOLHDL 4.9 01/21/2016 0617   VLDL 18 01/21/2016 0617   LDLCALC 133 (H) 01/21/2016 0617      Wt Readings from Last 3 Encounters:  09/16/16 193 lb (87.5 kg)  09/09/16 195 lb (88.5 kg)  08/05/16 205 lb (93 kg)       No flowsheet data found.    ASSESSMENT AND PLAN:  1.  Peripheral arterial disease:  right calf claudication due to SFA and popliteal disease.  He does not feel limited by this and thus I recommend continuing medical therapy.   2. Coronary artery disease involving native coronary arteries without angina: He is overall doing well. I had a prolonged discussion with him about the importance of taking his medications regularly. His medications were refilled.  3. Hyperlipidemia: Continue  treatment with atorvastatin with a target LDL of less than 70. He has not been taking atorvastatin which was refilled.  4. History of tobacco use: The patient quit smoking after his myocardial infarction.  5. Essential hypertension: Blood pressure is elevated but he has been out of losartan and metoprolol for more than a month. Refilled his medications.  Disposition:   FU with me in 12 months  Signed,  Lorine BearsMuhammad Xia Stohr, MD  09/16/2016 5:47 PM    [facility]

## 2016-09-17 ENCOUNTER — Encounter: Payer: Self-pay | Admitting: Family Medicine

## 2016-09-17 ENCOUNTER — Ambulatory Visit (INDEPENDENT_AMBULATORY_CARE_PROVIDER_SITE_OTHER): Payer: BC Managed Care – PPO | Admitting: Family Medicine

## 2016-09-17 VITALS — BP 124/86 | HR 67 | Temp 98.6°F | Resp 16 | Wt 197.0 lb

## 2016-09-17 DIAGNOSIS — N390 Urinary tract infection, site not specified: Secondary | ICD-10-CM | POA: Diagnosis not present

## 2016-09-17 DIAGNOSIS — B9620 Unspecified Escherichia coli [E. coli] as the cause of diseases classified elsewhere: Secondary | ICD-10-CM | POA: Diagnosis not present

## 2016-09-17 DIAGNOSIS — R310 Gross hematuria: Secondary | ICD-10-CM | POA: Diagnosis not present

## 2016-09-17 LAB — POCT URINALYSIS DIP (MANUAL ENTRY)
BILIRUBIN UA: NEGATIVE
GLUCOSE UA: NEGATIVE
Ketones, POC UA: NEGATIVE
Leukocytes, UA: NEGATIVE
Nitrite, UA: NEGATIVE
Protein Ur, POC: NEGATIVE
SPEC GRAV UA: 1.025
UROBILINOGEN UA: 0.2
pH, UA: 5

## 2016-09-17 NOTE — Progress Notes (Signed)
Chief Complaint  Patient presents with  . Follow-up    blood in urine    HPI   Pt reports that he completed his antibiotic Ciprofloxacin for a UTI He no longer has hematuria Reports that he takes saw palmetto which helped his symptoms   Past Medical History:  Diagnosis Date  . Allergy   . Arthritis   . Coronary artery disease    a. 01/2016: NSTEMI w/ 100% stenosis of Ost 2nd Diag --> POBA due to small vessel size, started on DAPT  . GERD (gastroesophageal reflux disease)   . History of blood transfusion 2006   "w/my back OR"  . History of hiatal hernia   . HLD (hyperlipidemia)    a. 01/2016: initiated on statin therapy  . HTN (hypertension)     Current Outpatient Prescriptions  Medication Sig Dispense Refill  . acetaminophen (TYLENOL) 500 MG tablet Take 500 mg by mouth every 6 (six) hours as needed for mild pain or headache.    Marland Kitchen. aspirin EC 81 MG EC tablet Take 1 tablet (81 mg total) by mouth daily.    Marland Kitchen. atorvastatin (LIPITOR) 80 MG tablet Take 1 tablet (80 mg total) by mouth daily at 6 PM. 90 tablet 3  . losartan (COZAAR) 50 MG tablet Take 1 tablet (50 mg total) by mouth daily. 90 tablet 3  . metoprolol tartrate (LOPRESSOR) 25 MG tablet Take 1 tablet (25 mg total) by mouth 2 (two) times daily. 180 tablet 3  . nitroGLYCERIN (NITROSTAT) 0.4 MG SL tablet Place 1 tablet (0.4 mg total) under the tongue every 5 (five) minutes as needed for chest pain. 25 tablet 3  . pantoprazole (PROTONIX) 40 MG tablet Take 1 tablet (40 mg total) by mouth daily. 90 tablet 3  . Saw Palmetto, Serenoa repens, 320 MG CAPS Take 320 mg by mouth daily.    . ticagrelor (BRILINTA) 90 MG TABS tablet Take 1 tablet (90 mg total) by mouth 2 (two) times daily. 180 tablet 3   No current facility-administered medications for this visit.     Allergies:  Allergies  Allergen Reactions  . Asa [Aspirin]     Burns stomach  . Lisinopril Cough    Past Surgical History:  Procedure Laterality Date  . BACK  SURGERY    . CARDIAC CATHETERIZATION  2006  . CARDIAC CATHETERIZATION N/A 01/21/2016   Procedure: Left Heart Cath and Coronary Angiography;  Surgeon: Marykay Lexavid W Harding, MD;  [facility];  Service: Cardiovascular;  Laterality: N/A;  . CARDIAC CATHETERIZATION N/A 01/21/2016   Procedure: Coronary Stent Intervention;  Surgeon: Marykay Lexavid W Harding, MD;  [facility];  Service: Cardiovascular;  Laterality: N/A;  . CORONARY ANGIOPLASTY  01/21/2016  . POSTERIOR LUMBAR FUSION  2006   "L4-5"    Social History   Social History  . Marital status: Married    Spouse name: N/A  . Number of children: N/A  . Years of education: N/A   Social History Main Topics  . Smoking status: Former Smoker    Packs/day: 0.12    Years: 48.00    Types: Cigarettes  . Smokeless tobacco: Never Used  . Alcohol use No  . Drug use: No  . Sexual activity: Yes   Other Topics Concern  . None   Social History Narrative  . None    Review of Systems  Constitutional: Negative for chills, fever and weight loss.  Gastrointestinal: Negative for abdominal pain, nausea and vomiting.  Genitourinary: Negative  for dysuria, flank pain, frequency, hematuria and urgency.    Objective: Vitals:   09/17/16 1256  BP: 124/86  Pulse: 67  Resp: 16  Temp: 98.6 F (37 C)  SpO2: 99%  Weight: 197 lb (89.4 kg)    Physical Exam  Constitutional: He appears well-developed and well-nourished.  HENT:  Head: Normocephalic and atraumatic.  Eyes: Conjunctivae are normal.  Cardiovascular: Normal rate, regular rhythm and normal heart sounds.   Pulmonary/Chest: Effort normal and breath sounds normal. No respiratory distress.     Component     Latest Ref Rng & Units 09/17/2016  WBC, UA     0 - 5 /hpf 0-5  RBC, UA     0 - 2 /hpf 0-2  Epithelial Cells (non renal)     0 - 10 /hpf 0-10  Casts     None seen /lpf None seen  Crystals     N/A Present (A)  Crystal Type     N/A Uric Acid  Mucus, UA      Not Estab. Present  Bacteria, UA     None seen/Few None seen    Assessment and Plan [name] was seen today for follow-up.  Diagnoses and all orders for this visit:  E-coli UTI -     POCT urinalysis dipstick -     Cancel: POCT Microscopic Urinalysis ([facility]) -     Urine Microscopic  Gross hematuria -     POCT urinalysis dipstick -     Cancel: POCT Microscopic Urinalysis ([facility]) -     Urine Microscopic  discussed that he should still follow up with Urology for enlarged prostate    Delos Klich A Creta LevinStallings

## 2016-09-17 NOTE — Patient Instructions (Signed)
     IF you received an x-ray today, you will receive an invoice from [facility]. Please contact [facility] at [phone] with questions or concerns regarding your invoice.   IF you received labwork today, you will receive an invoice from [facility]. Please contact [facility] at [phone] with questions or concerns regarding your invoice.   Our billing staff will not be able to assist you with questions regarding bills from these companies.  You will be contacted with the lab results as soon as they are available. The fastest way to get your results is to activate your My Chart account. Instructions are located on the last page of this paperwork. If you have not heard from us regarding the results in 2 weeks, please contact this office.      

## 2016-09-18 LAB — URINALYSIS, MICROSCOPIC ONLY
Bacteria, UA: NONE SEEN
CASTS: NONE SEEN /LPF

## 2016-09-20 ENCOUNTER — Telehealth: Payer: Self-pay | Admitting: Family Medicine

## 2016-09-20 NOTE — Telephone Encounter (Signed)
Discussed that his hematuria cleared on repeat UA

## 2016-11-07 ENCOUNTER — Encounter: Payer: Self-pay | Admitting: *Deleted

## 2016-11-10 ENCOUNTER — Ambulatory Visit (INDEPENDENT_AMBULATORY_CARE_PROVIDER_SITE_OTHER): Payer: BC Managed Care – PPO | Admitting: Physician Assistant

## 2016-11-10 ENCOUNTER — Encounter: Payer: Self-pay | Admitting: Physician Assistant

## 2016-11-10 ENCOUNTER — Encounter (INDEPENDENT_AMBULATORY_CARE_PROVIDER_SITE_OTHER): Payer: Self-pay

## 2016-11-10 VITALS — BP 132/70 | HR 54 | Ht 70.0 in | Wt 192.0 lb

## 2016-11-10 DIAGNOSIS — I2510 Atherosclerotic heart disease of native coronary artery without angina pectoris: Secondary | ICD-10-CM | POA: Insufficient documentation

## 2016-11-10 DIAGNOSIS — I739 Peripheral vascular disease, unspecified: Secondary | ICD-10-CM | POA: Insufficient documentation

## 2016-11-10 DIAGNOSIS — E785 Hyperlipidemia, unspecified: Secondary | ICD-10-CM | POA: Diagnosis not present

## 2016-11-10 DIAGNOSIS — I10 Essential (primary) hypertension: Secondary | ICD-10-CM | POA: Insufficient documentation

## 2016-11-10 NOTE — Patient Instructions (Signed)
Medication Instructions:  No changes.  Labwork: Come in fasting for labs: Lipids and LFTs  Testing/Procedures: None   Follow-Up: Dr. Tonny BollmanMichael Cooper in 05/2017  Any Other Special Instructions Will Be Listed Below (If Applicable).  If you need a refill on your cardiac medications before your next appointment, please call your pharmacy.

## 2016-11-10 NOTE — Progress Notes (Signed)
Cardiology Office Note:    Date:  11/10/2016   ID:  Billy Riley, DOB 07/30/1951, MRN [id number]  PCP:  Doristine Bosworth, MD  Cardiologist:  Dr. Tonny Bollman   Electrophysiologist:  n/a PV: Dr. Lorine Bears   Referring MD: Ofilia Neas, PA-C   Chief Complaint  Patient presents with  . Follow-up    CAD    History of Present Illness:    Billy Riley is a 66 y.o. male with a hx of CAD, HTN, HL, tobacco abuse.  He suffered a NSTEMI in 4/17 tx with POBA to an occluded D1 (small vessel).  His ejection fraction was preserved.  He was last seen by Dr. Tonny Bollman in 8/17.  He complained of intermittent claudication.  ABIs were abnormal and he was referred to Dr. Lorine Bears.  Med Rx was recommended due to non-lifestyle limiting claudication symptoms.  He was seen in the ED in 10/17 with chest pain that resolved with a GI cocktail.  Troponin levels were neg.  ECG showed no ST changes.  Last seen by Dr. Lorine Bears in 12/17.  He returns for Cardiology follow up.  He is here alone. He is doing well. He denies chest discomfort, shortness of breath, syncope, orthopnea, PND or edema. He denies significant changes in his intermittent claudication. He has quit smoking.  Prior CV studies that were reviewed today include:    05/15/16 Oscillating thrombus noted in the left CFA.  30-49% bilateral proximal SFA disease, without focal stenosis. 50-74% right distal SFA stenosis, higher by ratio criteria. 50-74% bilateral popliteal artery stenosis, higher by ratio criteria. Occluded bilateral anterior tibial arteries. Two vessel run-off, bilaterally. Appointment with Dr. Kirke Corin 06/17/16. Results sent to Dr. Excell Seltzer and Dr. Kirke Corin via epic.  Echo 5/17 EF 55-60, no RWMA, Asc aorta 39 mm, PASP 26  LHC 4/17 D2 100% EF 55-65 PCI:  POBA to D2 - vessel too small for stent   Past Medical History:  Diagnosis Date  . Allergy   . Arthritis   . Coronary artery disease    a. 01/2016:  NSTEMI w/ 100% stenosis of Ost 2nd Diag --> POBA due to small vessel size, started on DAPT  . GERD (gastroesophageal reflux disease)   . History of blood transfusion 2006   "w/my back OR"  . History of hiatal hernia   . HLD (hyperlipidemia)    a. 01/2016: initiated on statin therapy  . HTN (hypertension)     Past Surgical History:  Procedure Laterality Date  . BACK SURGERY    . CARDIAC CATHETERIZATION  2006  . CARDIAC CATHETERIZATION N/A 01/21/2016   Procedure: Left Heart Cath and Coronary Angiography;  Surgeon: Marykay Lex, MD;  [facility] LAB;  Service: Cardiovascular;  Laterality: N/A;  . CARDIAC CATHETERIZATION N/A 01/21/2016   Procedure: Coronary Stent Intervention;  Surgeon: Marykay Lex, MD;  [facility] CV LAB;  Service: Cardiovascular;  Laterality: N/A;  . CORONARY ANGIOPLASTY  01/21/2016  . POSTERIOR LUMBAR FUSION  2006   "L4-5"    Current Medications: Current Meds  Medication Sig  . acetaminophen (TYLENOL) 500 MG tablet Take 500 mg by mouth every 6 (six) hours as needed for mild pain or headache.  Marland Kitchen aspirin EC 81 MG EC tablet Take 1 tablet (81 mg total) by mouth daily.  Marland Kitchen atorvastatin (LIPITOR) 80 MG tablet Take 1 tablet (80 mg total) by mouth daily at 6 PM.  . losartan (COZAAR) 50 MG tablet  Take 1 tablet (50 mg total) by mouth daily.  . metoprolol tartrate (LOPRESSOR) 25 MG tablet Take 1 tablet (25 mg total) by mouth 2 (two) times daily.  . nitroGLYCERIN (NITROSTAT) 0.4 MG SL tablet Place 1 tablet (0.4 mg total) under the tongue every 5 (five) minutes as needed for chest pain.  . pantoprazole (PROTONIX) 40 MG tablet Take 1 tablet (40 mg total) by mouth daily.  . Saw Palmetto, Serenoa repens, 320 MG CAPS Take 320 mg by mouth daily.  . ticagrelor (BRILINTA) 90 MG TABS tablet Take 1 tablet (90 mg total) by mouth 2 (two) times daily.     Allergies:   Asa [aspirin] and Lisinopril   Social History   Social History  . Marital status: Married      Spouse name: N/A  . Number of children: N/A  . Years of education: N/A   Social History Main Topics  . Smoking status: Former Smoker    Packs/day: 0.12    Years: 48.00    Types: Cigarettes  . Smokeless tobacco: Never Used  . Alcohol use No  . Drug use: No  . Sexual activity: Yes   Other Topics Concern  . None   Social History Narrative  . None     Family History:  The patient's family history includes Cancer in his mother and sister; Diabetes in his sister; Heart disease in his brother and father.   ROS:   Please see the history of present illness.    Review of Systems  Eyes: Positive for visual disturbance.  Cardiovascular: Positive for dyspnea on exertion.  Musculoskeletal: Positive for back pain.   All other systems reviewed and are negative.   EKGs/Labs/Other Test Reviewed:    EKG:  EKG is not ordered today.  The ekg ordered today demonstrates n/a  Recent Labs: 01/21/2016: ALT 18; TSH 0.998 08/05/2016: BUN 10; Creatinine, Ser 1.17; Hemoglobin 13.4; Platelets 224; Potassium 3.8; Sodium 140   Recent Lipid Panel    Component Value Date/Time   CHOL 190 01/21/2016 0617   TRIG 89 01/21/2016 0617   HDL 39 (L) 01/21/2016 0617   CHOLHDL 4.9 01/21/2016 0617   VLDL 18 01/21/2016 0617   LDLCALC 133 (H) 01/21/2016 0617     Physical Exam:    VS:  BP 132/70   Pulse (!) 54   Ht 5\' 10"  (1.778 m)   Wt 192 lb (87.1 kg)   SpO2 98%   BMI 27.55 kg/m     Wt Readings from Last 3 Encounters:  11/10/16 192 lb (87.1 kg)  09/17/16 197 lb (89.4 kg)  09/16/16 193 lb (87.5 kg)     Physical Exam  Constitutional: He is oriented to person, place, and time. He appears well-developed and well-nourished. No distress.  HENT:  Head: Normocephalic and atraumatic.  Eyes: No scleral icterus.  Neck: Normal range of motion. No JVD present.  Cardiovascular: Normal rate, regular rhythm, S1 normal and S2 normal.   No murmur heard. Pulmonary/Chest: Effort normal and breath sounds  normal. He has no wheezes. He has no rhonchi. He has no rales.  Abdominal: Soft. There is no tenderness.  Musculoskeletal: He exhibits no edema.  Neurological: He is alert and oriented to person, place, and time.  Skin: Skin is warm and dry.  Psychiatric: He has a normal mood and affect.    ASSESSMENT:    1. Coronary artery disease involving native coronary artery of native heart without angina pectoris   2. Hyperlipidemia, unspecified hyperlipidemia type  3. Essential hypertension   4. PAD (peripheral artery disease) (HCC)    PLAN:    In order of problems listed above:  1. CAD - status post non-STEMI in 4/17 treated with balloon angioplasty only to an occluded D2. He had no other obstructive disease. Ejection fraction was preserved. I will review with Dr. [name] whether or not to continue him on dual antiplatelet therapy beyond 12 months after his ACS versus aspirin alone.  Continue aspirin, Brilinta, beta blocker, statin.  2. HL - Continue high-dose statin. Arrange fasting CMET, lipids.  3. HTN - Blood pressure controlled. Continue current therapy.  4. PAD - Claudication symptoms well controlled. Continue follow-up with PV.   Medication Adjustments/Labs and Tests Ordered: Current medicines are reviewed at length with the patient today.  Concerns regarding medicines are outlined above.  Medication changes, Labs and Tests ordered today are outlined in the Patient Instructions noted below. Patient Instructions  Medication Instructions:  No changes.  Labwork: Come in fasting for labs: Lipids and LFTs  Testing/Procedures: None   Follow-Up: Dr. Tonny BollmanMichael Cooper in 05/2017  Any Other Special Instructions Will Be Listed Below (If Applicable).  If you need a refill on your cardiac medications before your next appointment, please call your pharmacy.   Signed, Tereso NewcomerScott Levita Monical, PA-C  11/10/2016 12:59 PM    [facility] [address], [place], [place]   [address] Phone: [phone]; Fax: [phone]

## 2016-11-14 ENCOUNTER — Other Ambulatory Visit: Payer: BC Managed Care – PPO | Admitting: *Deleted

## 2016-11-14 DIAGNOSIS — I2510 Atherosclerotic heart disease of native coronary artery without angina pectoris: Secondary | ICD-10-CM

## 2016-11-14 DIAGNOSIS — E785 Hyperlipidemia, unspecified: Secondary | ICD-10-CM

## 2016-11-14 LAB — LIPID PANEL
CHOL/HDL RATIO: 3.4 ratio (ref 0.0–5.0)
Cholesterol, Total: 133 mg/dL (ref 100–199)
HDL: 39 mg/dL — ABNORMAL LOW (ref >39)
LDL CALC: 79 mg/dL (ref 0–99)
TRIGLYCERIDES: 73 mg/dL (ref 0–149)
VLDL CHOLESTEROL CAL: 15 mg/dL (ref 5–40)

## 2016-11-18 ENCOUNTER — Telehealth: Payer: Self-pay | Admitting: Physician Assistant

## 2016-11-18 NOTE — Telephone Encounter (Signed)
Reviewed with Dr. Tonny BollmanMichael Cooper. After 02/02/2017, he may stop Brilinta and remain on Aspirin alone. He may do this after he completes therapy with ASA and Brilinta on 02/02/17 or continue both until he sees Dr. Tonny BollmanMichael Cooper in follow up in August 2018. Tereso NewcomerScott Rayvion Stumph, PA-C   11/18/2016 12:45 PM

## 2016-11-18 NOTE — Telephone Encounter (Signed)
See lab results also. I placed call to pt but voicemail has not been set up.

## 2016-11-20 NOTE — Telephone Encounter (Signed)
NO ANSWER AGAIN WILL TRY LATER .Billy Riley/CY

## 2016-11-21 NOTE — Telephone Encounter (Signed)
S/w pt and went over recommendations per Tereso NewcomerScott Weaver, PA and Dr. [name] to continue dual therapy with the ASA and Brilinta. Pt aware his last dose of Brilinta will be 02/02/17 and after that he will remain on ASA alone. Pt thanked me for the good news as well as verbalized understanding to plan of care by phone.

## 2016-11-25 LAB — COMPREHENSIVE METABOLIC PANEL
A/G RATIO: 2 (ref 1.2–2.2)
ALT: 27 IU/L (ref 0–44)
AST: 17 IU/L (ref 0–40)
Albumin: 4.6 g/dL (ref 3.6–4.8)
Alkaline Phosphatase: 107 IU/L (ref 39–117)
BILIRUBIN TOTAL: 0.5 mg/dL (ref 0.0–1.2)
BUN/Creatinine Ratio: 8 — ABNORMAL LOW (ref 10–24)
BUN: 9 mg/dL (ref 8–27)
CALCIUM: 9.3 mg/dL (ref 8.6–10.2)
CHLORIDE: 103 mmol/L (ref 96–106)
CO2: 18 mmol/L (ref 18–29)
Creatinine, Ser: 1.09 mg/dL (ref 0.76–1.27)
GFR calc Af Amer: 82 mL/min/{1.73_m2} (ref >59)
GFR, EST NON AFRICAN AMERICAN: 71 mL/min/{1.73_m2} (ref >59)
GLUCOSE: 100 mg/dL — AB (ref 65–99)
Globulin, Total: 2.3 g/dL (ref 1.5–4.5)
POTASSIUM: 3.9 mmol/L (ref 3.5–5.2)
Sodium: 144 mmol/L (ref 134–144)
Total Protein: 6.9 g/dL (ref 6.0–8.5)

## 2016-11-25 LAB — SPECIMEN STATUS REPORT

## 2017-03-19 ENCOUNTER — Encounter: Payer: Self-pay | Admitting: Family Medicine

## 2017-03-19 ENCOUNTER — Ambulatory Visit (INDEPENDENT_AMBULATORY_CARE_PROVIDER_SITE_OTHER): Payer: BC Managed Care – PPO | Admitting: Family Medicine

## 2017-03-19 VITALS — BP 140/98 | HR 79 | Temp 98.6°F | Resp 18 | Ht 71.0 in | Wt 199.2 lb

## 2017-03-19 DIAGNOSIS — I10 Essential (primary) hypertension: Secondary | ICD-10-CM

## 2017-03-19 DIAGNOSIS — K0530 Chronic periodontitis, unspecified: Secondary | ICD-10-CM

## 2017-03-19 DIAGNOSIS — I25810 Atherosclerosis of coronary artery bypass graft(s) without angina pectoris: Secondary | ICD-10-CM

## 2017-03-19 MED ORDER — AMOXICILLIN 500 MG PO TABS: 500.0000 mg | ORAL_TABLET | Freq: Three times a day (TID) | ORAL | 0 refills | Status: AC

## 2017-03-19 NOTE — Patient Instructions (Addendum)
   IF you received an x-ray today, you will receive an invoice from [facility]. Please contact [facility] at [phone] with questions or concerns regarding your invoice.   IF you received labwork today, you will receive an invoice from [facility]. Please contact [facility] at [phone] with questions or concerns regarding your invoice.   Our billing staff will not be able to assist you with questions regarding bills from these companies.  You will be contacted with the lab results as soon as they are available. The fastest way to get your results is to activate your My Chart account. Instructions are located on the last page of this paperwork. If you have not heard from us regarding the results in 2 weeks, please contact this office.     Periodontal Disease Periodontal disease, also called gum disease or gingivitis, is inflammation, infection, or both that affects the tissue that surrounds and supports the teeth (periodontal tissue). Periodontal tissue includes the gums, the tissues (ligaments) that hold the teeth in place, and the tooth sockets (alveolar bones). Periodontal disease can affect tissue around one tooth or many teeth. If this condition is not treated, it can cause you to lose a tooth. What are the causes? This condition is usually caused by plaque. Plaque contains harmful bacteria that can cause gums to become swollen and infected. If periodontal disease gets worse (progresses), it can also damage other supporting tissues. Plaque can develop due to poor oral care, such as not brushing teeth enough, not visiting the dentist regularly, or eating and drinking too many sugary foods and beverages. What increases the risk? This condition is more likely to develop in people who:  Smoke.  Use tobacco.  Clench or grind their teeth.  Abuse substances.  Are going through the hormonal changes of puberty, menopause, or pregnancy.  Are under stress.  Are  taking certain medicines, such as steroids, antiseizure medicines, or medicines to treat cancer.  Have diabetes.  Have poor nutrition.  Have a disease that interferes with the body's disease-fighting system (immune system).  Have a family history of periodontal disease.  What are the signs or symptoms? Symptoms of this condition include:  Red or swollen gums.  Bad breath that does not go away.  Gums that have pulled away from the teeth.  Gums that bleed easily.  Teeth that are loose or separating.  Pain when chewing.  Changes in the way your teeth fit together.  Sensitive teeth.  How is this diagnosed? This condition is diagnosed with an exam of the tissue around your teeth. Your health care provider may also take an X-ray of your teeth and ask about your medical history. How is this treated? Treatment for this condition depends on the extent of the disease, which is determined by a dental exam. Treatment may include:  Brushing and flossing regularly.  A deep dental cleaning that involves scraping the buildup of plaque and tartar from below the gum line (scaling and root planing). This may be needed if the disease progresses.  Antibiotic medicines. These may be taken by mouth (orally) or as a rinse.  Surgery, in some severe cases. This may involve a surgery to lift up the gums and remove tartar deposits or to reduce a pocket of periodontal disease (flap surgery). Surgery might also involve procedures that replace damaged areas and help healthy bone and tissue to grow (bone and tissue grafting).  Follow these instructions at home:  Practice good oral hygiene: ? Brush your teeth   two times a day with a soft toothbrush. ? Floss between your teeth every day. ? Get regular dental exams.  If you were prescribed antibiotic medicine or a rinse, take or use it as told by your health care provider. Do not stop taking or using the antibiotic even if your condition  improves.  Take over-the-counter and prescription medicines only as told by your health care provider.  Do not use any products that contain nicotine or tobacco, such as cigarettes and e-cigarettes. If you need help quitting, ask your health care provider.  Eat a well-balanced diet that includes plenty of vegetables, fruits, whole grains, low-fat dairy products, and lean protein. ? Do not eat a lot of foods that are high in solid fats, added sugars, or salt. ? Avoid beverages that contain a lot of sugar. Contact a health care provider if:  Your symptoms do not improve. Get help right away if:  You have swelling in your face, neck, or jaw.  You have severe pain that does not get better with medicine.  You have a fever. Summary  Periodontal disease, also called gum disease or gingivitis, is inflammation, infection, or both that affects the tissue that surrounds and supports the teeth (periodontal tissue).  Practice good oral hygiene. Brush your teeth two times a day with a soft toothbrush. Floss between your teeth every day.  Do not use any products that contain nicotine or tobacco, such as cigarettes and e-cigarettes. If you need help quitting, ask your health care provider.  If you were prescribed antibiotic medicine or a rinse, take or use it as told by your health care provider. Do not stop taking or using the antibiotic even if your condition improves. This information is not intended to replace advice given to you by your health care provider. Make sure you discuss any questions you have with your health care provider. Document Released: 09/25/2003 Document Revised: 07/04/2016 Document Reviewed: 07/04/2016 Elsevier Interactive Patient Education  2018 Elsevier Inc.  

## 2017-03-19 NOTE — Progress Notes (Signed)
Chief Complaint  Patient presents with  . sinus congestion    x7 days, left side of face and "into the ear"  . Ear Pain    left ear pain that feels like it is throbbing.    HPI   Ear Pain  Pt reports that he has been having pain on the left upper jaw and thinks he had an abscess He reports that he was feeling pain in her left ear as well as throbbing sensation Pain with chewing Left forehead pain when he opens his mouth No fevers or chills No rash   Hypertension: Patient here for follow-up of elevated blood pressure. He is exercising and is adherent to low salt diet.  Blood pressure is well controlled at home. Cardiac symptoms none. Patient denies chest pain, chest pressure/discomfort, irregular heart beat, lower extremity edema and palpitations BP Readings from Last 3 Encounters:  03/19/17 (!) 140/98  11/10/16 132/70  09/17/16 124/86     Past Medical History:  Diagnosis Date  . Allergy   . Arthritis   . Coronary artery disease    a. 01/2016: NSTEMI w/ 100% stenosis of Ost 2nd Diag --> POBA due to small vessel size, started on DAPT  . GERD (gastroesophageal reflux disease)   . History of blood transfusion 2006   "w/my back OR"  . History of hiatal hernia   . HLD (hyperlipidemia)    a. 01/2016: initiated on statin therapy  . HTN (hypertension)     Current Outpatient Prescriptions  Medication Sig Dispense Refill  . acetaminophen (TYLENOL) 500 MG tablet Take 500 mg by mouth every 6 (six) hours as needed for mild pain or headache.    Marland Kitchen. aspirin EC 81 MG EC tablet Take 1 tablet (81 mg total) by mouth daily.    . Saw Palmetto, Serenoa repens, 320 MG CAPS Take 320 mg by mouth daily.    Marland Kitchen. amoxicillin (AMOXIL) 500 MG tablet Take 1 tablet (500 mg total) by mouth 3 (three) times daily. 30 tablet 0  . losartan (COZAAR) 50 MG tablet Take 1 tablet (50 mg total) by mouth daily. 90 tablet 3   No current facility-administered medications for this visit.     Allergies:  Allergies   Allergen Reactions  . Asa [Aspirin]     Burns stomach  . Lisinopril Cough    Past Surgical History:  Procedure Laterality Date  . BACK SURGERY    . CARDIAC CATHETERIZATION  2006  . CARDIAC CATHETERIZATION N/A 01/21/2016   Procedure: Left Heart Cath and Coronary Angiography;  Surgeon: Marykay Lexavid W Harding, MD;  [facility] CV LAB;  Service: Cardiovascular;  Laterality: N/A;  . CARDIAC CATHETERIZATION N/A 01/21/2016   Procedure: Coronary Stent Intervention;  Surgeon: Marykay Lexavid W Harding, MD;  [facility] CV LAB;  Service: Cardiovascular;  Laterality: N/A;  . CORONARY ANGIOPLASTY  01/21/2016  . POSTERIOR LUMBAR FUSION  2006   "L4-5"    Social History   Social History  . Marital status: Married    Spouse name: N/A  . Number of children: N/A  . Years of education: N/A   Social History Main Topics  . Smoking status: Former Smoker    Packs/day: 0.12    Years: 48.00    Types: Cigarettes  . Smokeless tobacco: Never Used  . Alcohol use No  . Drug use: No  . Sexual activity: Yes   Other Topics Concern  . None   Social History Narrative  . None    ROS  See hpi  Objective: Vitals:   03/19/17 1400  BP: (!) 140/98  Pulse: 79  Resp: 18  Temp: 98.6 F (37 C)  TempSrc: Oral  SpO2: 99%  Weight: 199 lb 3.2 oz (90.4 kg)  Height: 5\' 11"  (1.803 m)   BP Readings from Last 3 Encounters:  03/19/17 (!) 140/98  11/10/16 132/70  09/17/16 124/86     Physical Exam General: alert, oriented, in NAD Head: normocephalic, atraumatic, no sinus tenderness Eyes: EOM intact, no scleral icterus or conjunctival injection Mouth: edema of the gums along the  Ears: TM clear bilaterally Nose: mucosa nonerythematous, nonedematous Throat: no pharyngeal exudate or erythema Lymph: no posterior auricular, submental or cervical lymph adenopathy Heart: normal rate, normal sinus rhythm, no murmurs Lungs: clear to auscultation bilaterally, no wheezing   Assessment and Plan Billy Riley  was seen today for sinus congestion and ear pain.  Diagnoses and all orders for this visit:  Coronary artery disease involving coronary bypass graft of native heart without angina pectoris Essential hypertension - Cardiology decreased some of bp meds since pt is one year out from CAD  Periodontitis Advised follow up with [facility] -     amoxicillin (AMOXIL) 500 MG tablet; Take 1 tablet (500 mg total) by mouth 3 (three) times daily.     Billy Riley

## 2017-05-11 ENCOUNTER — Ambulatory Visit: Payer: BC Managed Care – PPO | Admitting: Physician Assistant

## 2017-07-22 ENCOUNTER — Ambulatory Visit (INDEPENDENT_AMBULATORY_CARE_PROVIDER_SITE_OTHER): Payer: Medicare Other | Admitting: Family Medicine

## 2017-07-22 ENCOUNTER — Encounter: Payer: Self-pay | Admitting: Family Medicine

## 2017-07-22 VITALS — BP 144/86 | HR 86 | Temp 98.3°F | Resp 16 | Ht 69.8 in | Wt 196.0 lb

## 2017-07-22 DIAGNOSIS — Z125 Encounter for screening for malignant neoplasm of prostate: Secondary | ICD-10-CM

## 2017-07-22 DIAGNOSIS — Z0000 Encounter for general adult medical examination without abnormal findings: Secondary | ICD-10-CM | POA: Diagnosis not present

## 2017-07-22 DIAGNOSIS — I25810 Atherosclerosis of coronary artery bypass graft(s) without angina pectoris: Secondary | ICD-10-CM

## 2017-07-22 DIAGNOSIS — I10 Essential (primary) hypertension: Secondary | ICD-10-CM

## 2017-07-22 DIAGNOSIS — Z131 Encounter for screening for diabetes mellitus: Secondary | ICD-10-CM | POA: Diagnosis not present

## 2017-07-22 DIAGNOSIS — Z1159 Encounter for screening for other viral diseases: Secondary | ICD-10-CM

## 2017-07-22 LAB — POCT URINALYSIS DIP (MANUAL ENTRY)
BILIRUBIN UA: NEGATIVE mg/dL
Bilirubin, UA: NEGATIVE
GLUCOSE UA: NEGATIVE mg/dL
Leukocytes, UA: NEGATIVE
Nitrite, UA: NEGATIVE
PROTEIN UA: NEGATIVE mg/dL
RBC UA: NEGATIVE
SPEC GRAV UA: 1.025 (ref 1.010–1.025)
Urobilinogen, UA: 0.2 E.U./dL
pH, UA: 5.5 (ref 5.0–8.0)

## 2017-07-22 NOTE — Patient Instructions (Signed)
     IF you received an x-ray today, you will receive an invoice from [facility]. Please contact [facility] at [phone] with questions or concerns regarding your invoice.   IF you received labwork today, you will receive an invoice from [facility]. Please contact [facility] at [phone] with questions or concerns regarding your invoice.   Our billing staff will not be able to assist you with questions regarding bills from these companies.  You will be contacted with the lab results as soon as they are available. The fastest way to get your results is to activate your My Chart account. Instructions are located on the last page of this paperwork. If you have not heard from us regarding the results in 2 weeks, please contact this office.     

## 2017-07-22 NOTE — Progress Notes (Signed)
Chief Complaint  Patient presents with  . CPE    Subjective:  Billy Riley is a 66 y.o. male here for a health maintenance visit.  Patient is established pt here for welcome to medicare exam  Patient Active Problem List   Diagnosis Date Noted  . PAD (peripheral artery disease) ([place]) 11/10/2016  . Coronary artery disease involving native coronary artery of native heart without angina pectoris 11/10/2016  . Hyperlipidemia 11/10/2016  . Essential hypertension 11/10/2016  . Systolic murmur of aorta 29/52/8413  . History of MI (myocardial infarction)   . Former smoker   . DDD (degenerative disc disease), lumbar 10/13/2012    Past Medical History:  Diagnosis Date  . Allergy   . Arthritis   . Coronary artery disease    a. 01/2016: NSTEMI w/ 100% stenosis of Ost 2nd Diag --> POBA due to small vessel size, started on DAPT  . GERD (gastroesophageal reflux disease)   . History of blood transfusion 2006   "w/my back OR"  . History of hiatal hernia   . HLD (hyperlipidemia)    a. 01/2016: initiated on statin therapy  . HTN (hypertension)     Past Surgical History:  Procedure Laterality Date  . BACK SURGERY    . CARDIAC CATHETERIZATION  2006  . CARDIAC CATHETERIZATION N/A 01/21/2016   Procedure: Left Heart Cath and Coronary Angiography;  Surgeon: Leonie Man, MD;  Location: [facility] CV LAB;  Service: Cardiovascular;  Laterality: N/A;  . CARDIAC CATHETERIZATION N/A 01/21/2016   Procedure: Coronary Stent Intervention;  Surgeon: Leonie Man, MD;  Location: [facility] CV LAB;  Service: Cardiovascular;  Laterality: N/A;  . CORONARY ANGIOPLASTY  01/21/2016  . POSTERIOR LUMBAR FUSION  2006   "L4-5"     Outpatient Medications Prior to Visit  Medication Sig Dispense Refill  . aspirin EC 81 MG EC tablet Take 1 tablet (81 mg total) by mouth daily.    . Saw Palmetto, Serenoa repens, 320 MG CAPS Take 320 mg by mouth daily.    Marland Kitchen acetaminophen (TYLENOL) 500 MG tablet Take 500 mg  by mouth every 6 (six) hours as needed for mild pain or headache.    . losartan (COZAAR) 50 MG tablet Take 1 tablet (50 mg total) by mouth daily. 90 tablet 3   No facility-administered medications prior to visit.     Allergies  Allergen Reactions  . Asa [Aspirin]     Burns stomach  . Lisinopril Cough     Family History  Problem Relation Age of Onset  . Cancer Mother        breast  . Heart disease Father        heart attack  . Cancer Sister        breast  . Heart disease Brother        heart attack  . Diabetes Sister      Health Habits: Dental Exam: up to date Eye Exam: up to date Exercise: 4 times/week on average Current exercise activities: walking/running  Functional Status Survey: Is the patient deaf or have difficulty hearing?: No Does the patient have difficulty seeing, even when wearing glasses/contacts?: No Does the patient have difficulty concentrating, remembering, or making decisions?: No Does the patient have difficulty walking or climbing stairs?: No Does the patient have difficulty dressing or bathing?: No Does the patient have difficulty doing errands alone such as visiting a doctor's office or shopping?: No   Social History   Social History  .  Marital status: Married    Spouse name: N/A  . Number of children: N/A  . Years of education: N/A   Occupational History  . Not on file.   Social History Main Topics  . Smoking status: Former Smoker    Packs/day: 0.12    Years: 48.00    Types: Cigarettes  . Smokeless tobacco: Never Used  . Alcohol use No  . Drug use: No  . Sexual activity: Yes   Other Topics Concern  . Not on file   Social History Narrative  . No narrative on file   History  Alcohol Use No   History  Smoking Status  . Former Smoker  . Packs/day: 0.12  . Years: 48.00  . Types: Cigarettes  Smokeless Tobacco  . Never Used   History  Drug Use No    Health Maintenance: See under health Maintenance activity for  review of completion dates as well.  There is no immunization history on file for this patient.   Functional Status Survey: Is the patient deaf or have difficulty hearing?: No Does the patient have difficulty seeing, even when wearing glasses/contacts?: No Does the patient have difficulty concentrating, remembering, or making decisions?: No Does the patient have difficulty walking or climbing stairs?: No Does the patient have difficulty dressing or bathing?: No Does the patient have difficulty doing errands alone such as visiting a doctor's office or shopping?: No    Depression Screen-PHQ2/9 Depression screen [facility] 2/9 07/22/2017 03/19/2017 09/17/2016 09/09/2016  Decreased Interest 0 0 0 0  Down, Depressed, Hopeless 0 0 0 -  PHQ - 2 Score 0 0 0 0    Depression Severity and Treatment Recommendations:  0-4= None  5-9= Mild / Treatment: Support, educate to call if worse; return in one month  10-14= Moderate / Treatment: Support, watchful waiting; Antidepressant or Psycotherapy  15-19= Moderately severe / Treatment: Antidepressant OR Psychotherapy  >= 20 = Major depression, severe / Antidepressant AND Psychotherapy    Review of Systems   Review of Systems  Constitutional: Negative for chills and fever.  HENT: Negative for hearing loss.   Respiratory: Negative for cough and shortness of breath.   Cardiovascular: Negative for chest pain and palpitations.  Gastrointestinal: Negative for abdominal pain, nausea and vomiting.  Genitourinary: Negative for dysuria and urgency.  Musculoskeletal: Negative for joint pain and myalgias.  Neurological: Positive for headaches. Negative for dizziness and tingling.    See HPI for ROS as well.    Objective:   Vitals:   07/22/17 1056 07/22/17 1142  BP: (!) 150/90 (!) 144/86  Pulse: 86   Resp: 16   Temp: 98.3 F (36.8 C)   TempSrc: Oral   SpO2: 96%   Weight: 196 lb (88.9 kg)   Height: 5' 9.8" (1.773 m)     Body mass index is 28.28  kg/m.  Physical Exam  Constitutional: He is oriented to person, place, and time. He appears well-developed and well-nourished.  HENT:  Head: Normocephalic and atraumatic.  Eyes: Conjunctivae and EOM are normal.  Neck: Normal range of motion. No thyromegaly present.  Cardiovascular: Normal rate, regular rhythm and normal heart sounds.   Pulmonary/Chest: Effort normal and breath sounds normal. No respiratory distress. He has no wheezes.  Abdominal: Soft. Bowel sounds are normal. He exhibits no distension. There is no tenderness.  Neurological: He is alert and oriented to person, place, and time. He has normal reflexes.  Psychiatric: He has a normal mood and affect. His behavior is normal.  Thought content normal.       Assessment/Plan:   Patient was seen for a health maintenance exam.  Counseled the patient on health maintenance issues. Reviewed her health mainteance schedule and ordered appropriate tests (see orders.) Counseled on regular exercise and weight management. Recommend regular eye exams and dental cleaning.   The following issues were addressed today for health maintenance:   Billy Riley was seen today for cpe.  Diagnoses and all orders for this visit:  Welcome to Medicare preventive visit- age appropriate screenings Pt under the care of Cardiology He is exercising and has met the screenings He decline vaccinations today Will continue to monitor  Screening for diabetes mellitus -     POCT urinalysis dipstick -     Cancel: Hemoglobin A1c  Essential hypertension -     Lipid panel -     Comprehensive metabolic panel  Screening for malignant neoplasm of prostate -     PSA  Need for hepatitis C screening test -     HCV Ab w/Rflx to Verification  Other orders -     Cancel: Flu Vaccine QUAD 36+ mos IM -     Cancel: Tdap vaccine greater than or equal to 7yo IM    Return in about 1 week (around 07/29/2017) for moles removed- will need procedure supplies.    Body  mass index is 28.28 kg/m.:  Discussed the patient's BMI with patient. The BMI body mass index is 28.28 kg/m.     Future Appointments Date Time Provider [place]  07/29/2017 1:20 PM Forrest Moron, MD PCP-PCP [facility]    Patient Instructions       IF you received an x-ray today, you will receive an invoice from [facility]. Please contact [facility] at [phone] with questions or concerns regarding your invoice.   IF you received labwork today, you will receive an invoice from [facility]. Please contact [facility] at [phone] with questions or concerns regarding your invoice.   Our billing staff will not be able to assist you with questions regarding bills from these companies.  You will be contacted with the lab results as soon as they are available. The fastest way to get your results is to activate your My Chart account. Instructions are located on the last page of this paperwork. If you have not heard from [place] regarding the results in 2 weeks, please contact this office.

## 2017-07-23 LAB — COMPREHENSIVE METABOLIC PANEL
ALBUMIN: 4.8 g/dL (ref 3.6–4.8)
ALK PHOS: 99 IU/L (ref 39–117)
ALT: 14 IU/L (ref 0–44)
AST: 14 IU/L (ref 0–40)
Albumin/Globulin Ratio: 1.9 (ref 1.2–2.2)
BILIRUBIN TOTAL: 0.5 mg/dL (ref 0.0–1.2)
BUN / CREAT RATIO: 8 — AB (ref 10–24)
BUN: 10 mg/dL (ref 8–27)
CHLORIDE: 102 mmol/L (ref 96–106)
CO2: 24 mmol/L (ref 20–29)
CREATININE: 1.18 mg/dL (ref 0.76–1.27)
Calcium: 9.7 mg/dL (ref 8.6–10.2)
GFR calc Af Amer: 75 mL/min/{1.73_m2} (ref >59)
GFR calc non Af Amer: 65 mL/min/{1.73_m2} (ref >59)
GLUCOSE: 93 mg/dL (ref 65–99)
Globulin, Total: 2.5 g/dL (ref 1.5–4.5)
Potassium: 4.3 mmol/L (ref 3.5–5.2)
Sodium: 141 mmol/L (ref 134–144)
Total Protein: 7.3 g/dL (ref 6.0–8.5)

## 2017-07-23 LAB — HCV INTERPRETATION

## 2017-07-23 LAB — PSA: PROSTATE SPECIFIC AG, SERUM: 0.6 ng/mL (ref 0.0–4.0)

## 2017-07-23 LAB — LIPID PANEL
CHOL/HDL RATIO: 5.8 ratio — AB (ref 0.0–5.0)
Cholesterol, Total: 260 mg/dL — ABNORMAL HIGH (ref 100–199)
HDL: 45 mg/dL (ref >39)
LDL CALC: 184 mg/dL — AB (ref 0–99)
Triglycerides: 157 mg/dL — ABNORMAL HIGH (ref 0–149)
VLDL CHOLESTEROL CAL: 31 mg/dL (ref 5–40)

## 2017-07-23 LAB — HCV AB W/RFLX TO VERIFICATION

## 2017-07-23 MED ORDER — ATORVASTATIN CALCIUM 40 MG PO TABS: 40.0000 mg | ORAL_TABLET | Freq: Every day | ORAL | 3 refills | Status: AC

## 2017-07-23 NOTE — Addendum Note (Signed)
Addended by: Collie SiadSTALLINGS, Gwendolynn Merkey A on: 07/23/2017 10:42 AM   Modules accepted: Orders

## 2017-07-29 ENCOUNTER — Ambulatory Visit (INDEPENDENT_AMBULATORY_CARE_PROVIDER_SITE_OTHER): Payer: Medicare Other | Admitting: Family Medicine

## 2017-07-29 ENCOUNTER — Encounter: Payer: Self-pay | Admitting: Family Medicine

## 2017-07-29 DIAGNOSIS — L918 Other hypertrophic disorders of the skin: Secondary | ICD-10-CM

## 2017-07-29 NOTE — Patient Instructions (Addendum)
     IF you received an x-ray today, you will receive an invoice from [facility]. Please contact [facility] at [phone] with questions or concerns regarding your invoice.   IF you received labwork today, you will receive an invoice from [facility]. Please contact [facility] at [phone] with questions or concerns regarding your invoice.   Our billing staff will not be able to assist you with questions regarding bills from these companies.  You will be contacted with the lab results as soon as they are available. The fastest way to get your results is to activate your My Chart account. Instructions are located on the last page of this paperwork. If you have not heard from us regarding the results in 2 weeks, please contact this office.    Excision of Skin Lesions, Care After Refer to this sheet in the next few weeks. These instructions provide you with information about caring for yourself after your procedure. Your health care provider may also give you more specific instructions. Your treatment has been planned according to current medical practices, but problems sometimes occur. Call your health care provider if you have any problems or questions after your procedure. What can I expect after the procedure? After your procedure, it is common to have pain or discomfort at the excision site. Follow these instructions at home:  Take over-the-counter and prescription medicines only as told by your health care provider.  Follow instructions from your health care provider about: ? How to take care of your excision site. You should keep the site clean, dry, and protected for at least 48 hours. ? When and how you should change your bandage (dressing). ? When you should remove your dressing. ? Removing whatever was used to close your excision site.  Check the excision area every day for signs of infection. Watch for: ? Redness, swelling, or pain. ? Fluid, blood, or  pus.  For bleeding, apply gentle but firm pressure to the area using a folded towel for 20 minutes.  Avoid high-impact exercise and activities until the stitches (sutures) are removed or the area heals.  Follow instructions from your health care provider about how to minimize scarring. Avoid sun exposure until the area has healed. Scarring should lessen over time.  Keep all follow-up visits as told by your health care provider. This is important. Contact a health care provider if:  You have a fever.  You have redness, swelling, or pain at the excision site.  You have fluid, blood, or pus coming from the excision site.  You have ongoing bleeding at the excision site.  You have pain that does not improve in 2-3 days after your procedure.  You notice skin irregularities or changes in sensation. This information is not intended to replace advice given to you by your health care provider. Make sure you discuss any questions you have with your health care provider. Document Released: 02/06/2015 Document Revised: 02/28/2016 Document Reviewed: 11/08/2014 Elsevier Interactive Patient Education  2018 [employer]vier Inc.  

## 2017-07-29 NOTE — Progress Notes (Addendum)
Chief Complaint  Patient presents with  . MOLE REMOVAL    HPI  Pt here for skin tag removal    4 review of systems  Past Medical History:  Diagnosis Date  . Allergy   . Arthritis   . Coronary artery disease    a. 01/2016: NSTEMI w/ 100% stenosis of Ost 2nd Diag --> POBA due to small vessel size, started on DAPT  . GERD (gastroesophageal reflux disease)   . History of blood transfusion 2006   "w/my back OR"  . History of hiatal hernia   . HLD (hyperlipidemia)    a. 01/2016: initiated on statin therapy  . HTN (hypertension)     Current Outpatient Medications  Medication Sig Dispense Refill  . acetaminophen (TYLENOL) 500 MG tablet Take 500 mg by mouth every 6 (six) hours as needed for mild pain or headache.    Marland Kitchen. aspirin EC 81 MG EC tablet Take 1 tablet (81 mg total) by mouth daily.    Marland Kitchen. atorvastatin (LIPITOR) 40 MG tablet Take 1 tablet (40 mg total) by mouth daily. 90 tablet 3  . ranitidine (ZANTAC) 150 MG tablet Take 150 mg by mouth daily.    . Saw Palmetto, Serenoa repens, 320 MG CAPS Take 320 mg by mouth daily.    . fluticasone (FLONASE) 50 MCG/ACT nasal spray Place 2 sprays into both nostrils daily. 16 g 6  . loratadine (CLARITIN) 10 MG tablet Take 1 tablet (10 mg total) by mouth daily. 30 tablet 11  . olmesartan (BENICAR) 20 MG tablet Take 1 tablet (20 mg total) by mouth daily. 90 tablet 1   No current facility-administered medications for this visit.     Allergies:  Allergies  Allergen Reactions  . Asa [Aspirin]     Burns stomach  . Lisinopril Cough    Past Surgical History:  Procedure Laterality Date  . BACK SURGERY    . CARDIAC CATHETERIZATION  2006  . CARDIAC CATHETERIZATION N/A 01/21/2016   Procedure: Left Heart Cath and Coronary Angiography;  Surgeon: Marykay Lexavid W Harding, MD;  [facility];  Service: Cardiovascular;  Laterality: N/A;  . CARDIAC CATHETERIZATION N/A 01/21/2016   Procedure: Coronary Stent Intervention;  Surgeon: Marykay Lexavid W  Harding, MD;  [facility];  Service: Cardiovascular;  Laterality: N/A;  . CORONARY ANGIOPLASTY  01/21/2016  . POSTERIOR LUMBAR FUSION  2006   "L4-5"    Social History   Socioeconomic History  . Marital status: Married    Spouse name: Not on file  . Number of children: Not on file  . Years of education: Not on file  . Highest education level: Not on file  Occupational History  . Not on file  Social Needs  . Financial resource strain: Not on file  . Food insecurity:    Worry: Not on file    Inability: Not on file  . Transportation needs:    Medical: Not on file    Non-medical: Not on file  Tobacco Use  . Smoking status: Former Smoker    Packs/day: 0.12    Years: 48.00    Pack years: 5.76    Types: Cigarettes  . Smokeless tobacco: Never Used  Substance and Sexual Activity  . Alcohol use: No  . Drug use: No  . Sexual activity: Yes  Lifestyle  . Physical activity:    Days per week: Not on file    Minutes per session: Not on file  . Stress: Not on file  Relationships  . Social connections:    Talks on phone: Not on file    Gets together: Not on file    Attends religious service: Not on file    Active member of club or organization: Not on file    Attends meetings of clubs or organizations: Not on file    Relationship status: Not on file  Other Topics Concern  . Not on file  Social History Narrative  . Not on file    Family History  Problem Relation Age of Onset  . Cancer Mother        breast  . Heart disease Father        heart attack  . Cancer Sister        breast  . Heart disease Brother        heart attack  . Diabetes Sister      ROS Review of Systems See HPI Constitution: No fevers or chills No malaise No diaphoresis Skin: No rash or itching Eyes: no blurry vision, no double vision GU: no dysuria or hematuria Neuro: no dizziness or headaches * all others reviewed and negative   Objective: Vitals:   07/29/17 1337  BP: (!)  150/88  Pulse: 85  Resp: 17  Temp: 98.3 F (36.8 C)  TempSrc: Oral  SpO2: 98%  Weight: 200 lb 9.6 oz (91 kg)  Height: 5' 9.8" (1.773 m)    Physical Exam  Procedure explained Informed consent received Using a sharp scapel 6 skin tags removed and dressing applied ebl minimal    Assessment and Plan Laurice was seen today for mole removal.  Diagnoses and all orders for this visit:  Cutaneous skin tags   Removal of 6 skin tag from face and neck Pt tolerated procedure well  Zoe A Creta Levin

## 2017-08-14 ENCOUNTER — Telehealth: Payer: Self-pay | Admitting: Family Medicine

## 2017-08-14 NOTE — Telephone Encounter (Signed)
Copied from CRM #[id number]. Topic: Quick Communication - See Telephone Encounter >> Aug 14, 2017  9:16 AM Clack, Princella PellegriniJessica D wrote: CRM for notification. See Telephone encounter for: Pt is returning a phone call about his labs he states he did not understand the vmail that was left. Please follow up with pt  08/14/17.

## 2017-08-17 NOTE — Telephone Encounter (Signed)
Left detailed message again with results.

## 2017-09-22 ENCOUNTER — Telehealth: Payer: Self-pay | Admitting: Family Medicine

## 2017-09-22 NOTE — Telephone Encounter (Signed)
Called pt. To reschedule his appt with Dr. Creta LevinStallings on 09/30/17. Dr. Creta LevinStallings will not be in the office that day. When patient calls back, please reschedule him for any day other than 09/30/17 OR 10/05/17.  Thanks!

## 2017-09-30 ENCOUNTER — Ambulatory Visit: Payer: Medicare Other | Admitting: Family Medicine

## 2017-10-08 ENCOUNTER — Ambulatory Visit (INDEPENDENT_AMBULATORY_CARE_PROVIDER_SITE_OTHER): Payer: Medicare Other | Admitting: Family Medicine

## 2017-10-08 ENCOUNTER — Encounter: Payer: Self-pay | Admitting: Family Medicine

## 2017-10-08 ENCOUNTER — Other Ambulatory Visit: Payer: Self-pay

## 2017-10-08 VITALS — BP 162/88 | HR 65 | Temp 98.9°F | Resp 16 | Ht 69.8 in | Wt 199.2 lb

## 2017-10-08 DIAGNOSIS — J3089 Other allergic rhinitis: Secondary | ICD-10-CM

## 2017-10-08 DIAGNOSIS — I10 Essential (primary) hypertension: Secondary | ICD-10-CM

## 2017-10-08 DIAGNOSIS — E782 Mixed hyperlipidemia: Secondary | ICD-10-CM | POA: Diagnosis not present

## 2017-10-08 LAB — COMPREHENSIVE METABOLIC PANEL
ALT: 19 IU/L (ref 0–44)
AST: 16 IU/L (ref 0–40)
Albumin/Globulin Ratio: 1.7 (ref 1.2–2.2)
Albumin: 4.5 g/dL (ref 3.6–4.8)
Alkaline Phosphatase: 115 IU/L (ref 39–117)
BILIRUBIN TOTAL: 0.7 mg/dL (ref 0.0–1.2)
BUN/Creatinine Ratio: 8 — ABNORMAL LOW (ref 10–24)
BUN: 9 mg/dL (ref 8–27)
CHLORIDE: 104 mmol/L (ref 96–106)
CO2: 21 mmol/L (ref 20–29)
Calcium: 9.7 mg/dL (ref 8.6–10.2)
Creatinine, Ser: 1.06 mg/dL (ref 0.76–1.27)
GFR calc non Af Amer: 73 mL/min/{1.73_m2} (ref >59)
GFR, EST AFRICAN AMERICAN: 85 mL/min/{1.73_m2} (ref >59)
GLUCOSE: 125 mg/dL — AB (ref 65–99)
Globulin, Total: 2.6 g/dL (ref 1.5–4.5)
Potassium: 3.9 mmol/L (ref 3.5–5.2)
Sodium: 141 mmol/L (ref 134–144)
TOTAL PROTEIN: 7.1 g/dL (ref 6.0–8.5)

## 2017-10-08 LAB — LIPID PANEL
CHOL/HDL RATIO: 3.5 ratio (ref 0.0–5.0)
Cholesterol, Total: 150 mg/dL (ref 100–199)
HDL: 43 mg/dL (ref >39)
LDL Calculated: 88 mg/dL (ref 0–99)
Triglycerides: 93 mg/dL (ref 0–149)
VLDL Cholesterol Cal: 19 mg/dL (ref 5–40)

## 2017-10-08 MED ORDER — FLUTICASONE PROPIONATE 50 MCG/ACT NA SUSP: 2.0000 | Freq: Every day | NASAL | 6 refills | Status: AC

## 2017-10-08 MED ORDER — LORATADINE 10 MG PO TABS: 10.0000 mg | ORAL_TABLET | Freq: Every day | ORAL | 11 refills | Status: AC

## 2017-10-08 MED ORDER — OLMESARTAN MEDOXOMIL 20 MG PO TABS: 20.0000 mg | ORAL_TABLET | Freq: Every day | ORAL | 1 refills | Status: DC

## 2017-10-08 NOTE — Progress Notes (Signed)
Chief Complaint  Patient presents with  . Follow-up    2 month f/u cholesterol    HPI   Acute Sinus Congestion Pt reports that this morning he work up with sinus congestion, postnasal drip and sneezing He has not eaten yet and just feels sick overall   Dyslipidemia: Patient presents for evaluation of lipids.  Compliance with treatment thus far has been good.  A repeat fasting lipid profile was done.  The patient does not use medications that may worsen dyslipidemias (corticosteroids, progestins, anabolic steroids, diuretics, beta-blockers, amiodarone, cyclosporine, olanzapine). The patient exercises intermittently.  The patient is known to have coexisting coronary artery disease.   Hypertension: Patient here for follow-up of elevated blood pressure. He is not exercising and is adherent to low salt diet.  Blood pressure is not well controlled at home. Cardiac symptoms none. Patient denies chest pain, chest pressure/discomfort, claudication, dyspnea, exertional chest pressure/discomfort, fatigue, irregular heart beat and lower extremity edema.  Cardiovascular risk factors: advanced age (older than 6655 for men, 2365 for women), dyslipidemia, family history of premature cardiovascular disease, hypertension and male gender. Use of agents associated with hypertension: none. History of target organ damage: angina/ prior myocardial infarction. BP Readings from Last 3 Encounters:  10/08/17 (!) 162/88  07/29/17 (!) 150/88  07/22/17 (!) 144/86    He thinks his pressure is high due to the recent death of his sister who suddenly dropped dead Aug 06 2017   Past Medical History:  Diagnosis Date  . Allergy   . Arthritis   . Coronary artery disease    a. 01/2016: NSTEMI w/ 100% stenosis of Ost 2nd Diag --> POBA due to small vessel size, started on DAPT  . GERD (gastroesophageal reflux disease)   . History of blood transfusion 2006   "w/my back OR"  . History of hiatal hernia   . HLD (hyperlipidemia)     a. 01/2016: initiated on statin therapy  . HTN (hypertension)     Current Outpatient Medications  Medication Sig Dispense Refill  . acetaminophen (TYLENOL) 500 MG tablet Take 500 mg by mouth every 6 (six) hours as needed for mild pain or headache.    Marland Kitchen. aspirin EC 81 MG EC tablet Take 1 tablet (81 mg total) by mouth daily.    Marland Kitchen. atorvastatin (LIPITOR) 40 MG tablet Take 1 tablet (40 mg total) by mouth daily. 90 tablet 3  . ranitidine (ZANTAC) 150 MG tablet Take 150 mg by mouth daily.    . Saw Palmetto, Serenoa repens, 320 MG CAPS Take 320 mg by mouth daily.    . fluticasone (FLONASE) 50 MCG/ACT nasal spray Place 2 sprays into both nostrils daily. 16 g 6  . loratadine (CLARITIN) 10 MG tablet Take 1 tablet (10 mg total) by mouth daily. 30 tablet 11  . olmesartan (BENICAR) 20 MG tablet Take 1 tablet (20 mg total) by mouth daily. 90 tablet 1   No current facility-administered medications for this visit.     Allergies:  Allergies  Allergen Reactions  . Asa [Aspirin]     Burns stomach  . Lisinopril Cough    Past Surgical History:  Procedure Laterality Date  . BACK SURGERY    . CARDIAC CATHETERIZATION  2006  . CARDIAC CATHETERIZATION N/A 01/21/2016   Procedure: Left Heart Cath and Coronary Angiography;  Surgeon: Marykay Lexavid W Harding, MD;  [facility];  Service: Cardiovascular;  Laterality: N/A;  . CARDIAC CATHETERIZATION N/A 01/21/2016   Procedure: Coronary Stent Intervention;  Surgeon:  Marykay Lex, MD;  [facility];  Service: Cardiovascular;  Laterality: N/A;  . CORONARY ANGIOPLASTY  01/21/2016  . POSTERIOR LUMBAR FUSION  2006   "L4-5"    Social History   Socioeconomic History  . Marital status: Married    Spouse name: None  . Number of children: None  . Years of education: None  . Highest education level: None  Social Needs  . Financial resource strain: None  . Food insecurity - worry: None  . Food insecurity - inability: None  .  Transportation needs - medical: None  . Transportation needs - non-medical: None  Occupational History  . None  Tobacco Use  . Smoking status: Former Smoker    Packs/day: 0.12    Years: 48.00    Pack years: 5.76    Types: Cigarettes  . Smokeless tobacco: Never Used  Substance and Sexual Activity  . Alcohol use: No  . Drug use: No  . Sexual activity: Yes  Other Topics Concern  . None  Social History Narrative  . None    Family History  Problem Relation Age of Onset  . Cancer Mother        breast  . Heart disease Father        heart attack  . Cancer Sister        breast  . Heart disease Brother        heart attack  . Diabetes Sister      ROS Review of Systems See HPI Constitution: No fevers or chills No malaise No diaphoresis Skin: No rash or itching Eyes: no blurry vision, no double vision GU: no dysuria or hematuria Neuro: no dizziness or headaches  all others reviewed and negative   Objective: Vitals:   10/08/17 1059 10/08/17 1147  BP: (!) 176/96 (!) 162/88  Pulse: 65   Resp: 16   Temp: 98.9 F (37.2 C)   TempSrc: Oral   SpO2: 98%   Weight: 199 lb 3.2 oz (90.4 kg)   Height: 5' 9.8" (1.773 m)     Physical Exam General: alert, oriented, in NAD Head: normocephalic, atraumatic, no sinus tenderness Eyes: EOM intact, no scleral icterus or conjunctival injection Ears: TM clear bilaterally Nose: mucosa erythematous, +edematous Throat: no pharyngeal exudate or erythema Lymph: no posterior auricular, submental or cervical lymph adenopathy Heart: normal rate, normal sinus rhythm, no murmurs Lungs: clear to auscultation bilaterally, no wheezing   Lab Results  Component Value Date   CHOL 260 (H) 07/22/2017   CHOL 133 11/14/2016   CHOL 190 01/21/2016   Lab Results  Component Value Date   HDL 45 07/22/2017   HDL 39 (L) 11/14/2016   HDL 39 (L) 01/21/2016   Lab Results  Component Value Date   LDLCALC 184 (H) 07/22/2017   LDLCALC 79 11/14/2016    LDLCALC 133 (H) 01/21/2016   Lab Results  Component Value Date   TRIG 157 (H) 07/22/2017   TRIG 73 11/14/2016   TRIG 89 01/21/2016   Lab Results  Component Value Date   CHOLHDL 5.8 (H) 07/22/2017   CHOLHDL 3.4 11/14/2016   CHOLHDL 4.9 01/21/2016   No results found for: LDLDIRECT  Assessment and Plan Billy Riley was seen today for follow-up.  Diagnoses and all orders for this visit:  Mixed hyperlipidemia- will check today  -     Lipid panel -     Comprehensive metabolic panel  Essential hypertension- bp has been increasing since May Discussed that given that  the had an MI would start an ARB since he is intolerant to ACEi He previously tolerated losartan Due to recalls of valsartan and losartan will start olmesartan One month follow up advised Home bp monitoring advised Aggravating factors: stress from the recent death of his younger sister  Non-seasonal allergic rhinitis due to other allergic trigger- advised claritin and flonase Avoid otc sinus meds  Other orders -     Cancel: Tdap vaccine greater than or equal to 7yo IM -     Cancel: Flu Vaccine QUAD 36+ mos IM -     olmesartan (BENICAR) 20 MG tablet; Take 1 tablet (20 mg total) by mouth daily. -     loratadine (CLARITIN) 10 MG tablet; Take 1 tablet (10 mg total) by mouth daily. -     fluticasone (FLONASE) 50 MCG/ACT nasal spray; Place 2 sprays into both nostrils daily.     Billy Riley

## 2017-10-08 NOTE — Patient Instructions (Addendum)
Mist the flonase so that you can get the medicine in the nasal passage Check you bp at home Goal is bp less than 130/80    IF you received an x-ray today, you will receive an invoice from [facility]. Please contact [facility] at [phone] with questions or concerns regarding your invoice.   IF you received labwork today, you will receive an invoice from [facility]. Please contact [facility] at [phone] with questions or concerns regarding your invoice.   Our billing staff will not be able to assist you with questions regarding bills from these companies.  You will be contacted with the lab results as soon as they are available. The fastest way to get your results is to activate your My Chart account. Instructions are located on the last page of this paperwork. If you have not heard from us regarding the results in 2 weeks, please contact this office.     How to Take Your Blood Pressure Blood pressure is a measurement of how strongly your blood is pressing against the walls of your arteries. Arteries are blood vessels that carry blood from your heart throughout your body. Your health care provider takes your blood pressure at each office visit. You can also take your own blood pressure at home with a blood pressure machine. You may need to take your own blood pressure:  To confirm a diagnosis of high blood pressure (hypertension).  To monitor your blood pressure over time.  To make sure your blood pressure medicine is working.  Supplies needed: To take your blood pressure, you will need a blood pressure machine. You can buy a blood pressure machine, or blood pressure monitor, at most drugstores or online. There are several types of home blood pressure monitors. When choosing one, consider the following:  Choose a monitor that has an arm cuff.  Choose a monitor that wraps snugly around your upper arm. You should be able to fit only one finger between your arm and  the cuff.  Do not choose a monitor that measures your blood pressure from your wrist or finger.  Your health care provider can suggest a reliable monitor that will meet your needs. How to prepare To get the most accurate reading, avoid the following for 30 minutes before you check your blood pressure:  Drinking caffeine.  Drinking alcohol.  Eating.  Smoking.  Exercising.  Five minutes before you check your blood pressure:  Empty your bladder.  Sit quietly without talking in a dining chair, rather than in a soft couch or armchair.  How to take your blood pressure To check your blood pressure, follow the instructions in the manual that came with your blood pressure monitor. If you have a digital blood pressure monitor, the instructions may be as follows: 1. Sit up straight. 2. Place your feet on the floor. Do not cross your ankles or legs. 3. Rest your left arm at the level of your heart on a table or desk or on the arm of a chair. 4. Pull up your shirt sleeve. 5. Wrap the blood pressure cuff around the upper part of your left arm, 1 inch (2.5 cm) above your elbow. It is best to wrap the cuff around bare skin. 6. Fit the cuff snugly around your arm. You should be able to place only one finger between the cuff and your arm. 7. Position the cord inside the groove of your elbow. 8. Press the power button. 9. Sit quietly while the cuff inflates and  deflates. 10. Read the digital reading on the monitor screen and write it down (record it). 11. Wait 2-3 minutes, then repeat the steps, starting at step 1.  What does my blood pressure reading mean? A blood pressure reading consists of a higher number over a lower number. Ideally, your blood pressure should be below 120/80. The first ("top") number is called the systolic pressure. It is a measure of the pressure in your arteries as your heart beats. The second ("bottom") number is called the diastolic pressure. It is a measure of the  pressure in your arteries as the heart relaxes. Blood pressure is classified into four stages. The following are the stages for adults who do not have a short-term serious illness or a chronic condition. Systolic pressure and diastolic pressure are measured in a unit called mm Hg. Normal  Systolic pressure: below 120.  Diastolic pressure: below 80. Elevated  Systolic pressure: 120-129.  Diastolic pressure: below 80. Hypertension stage 1  Systolic pressure: 130-139.  Diastolic pressure: 80-89. Hypertension stage 2  Systolic pressure: 140 or above.  Diastolic pressure: 90 or above. You can have prehypertension or hypertension even if only the systolic or only the diastolic number in your reading is higher than normal. Follow these instructions at home:  Check your blood pressure as often as recommended by your health care provider.  Take your monitor to the next appointment with your health care provider to make sure: ? That you are using it correctly. ? That it provides accurate readings.  Be sure you understand what your goal blood pressure numbers are.  Tell your health care provider if you are having any side effects from blood pressure medicine. Contact a health care provider if:  Your blood pressure is consistently high. Get help right away if:  Your systolic blood pressure is higher than 180.  Your diastolic blood pressure is higher than 110. This information is not intended to replace advice given to you by your health care provider. Make sure you discuss any questions you have with your health care provider. Document Released: 02/29/2016 Document Revised: 05/13/2016 Document Reviewed: 02/29/2016 Elsevier Interactive Patient Education  [employer].

## 2017-10-16 IMAGING — CR DG CHEST 2V
2 series · 2 of 2 positions shown · non-contrast
Comparison: None.

CLINICAL DATA: Left-sided chest pain since last night.

EXAM:
CHEST  2 VIEW

[chest pa]
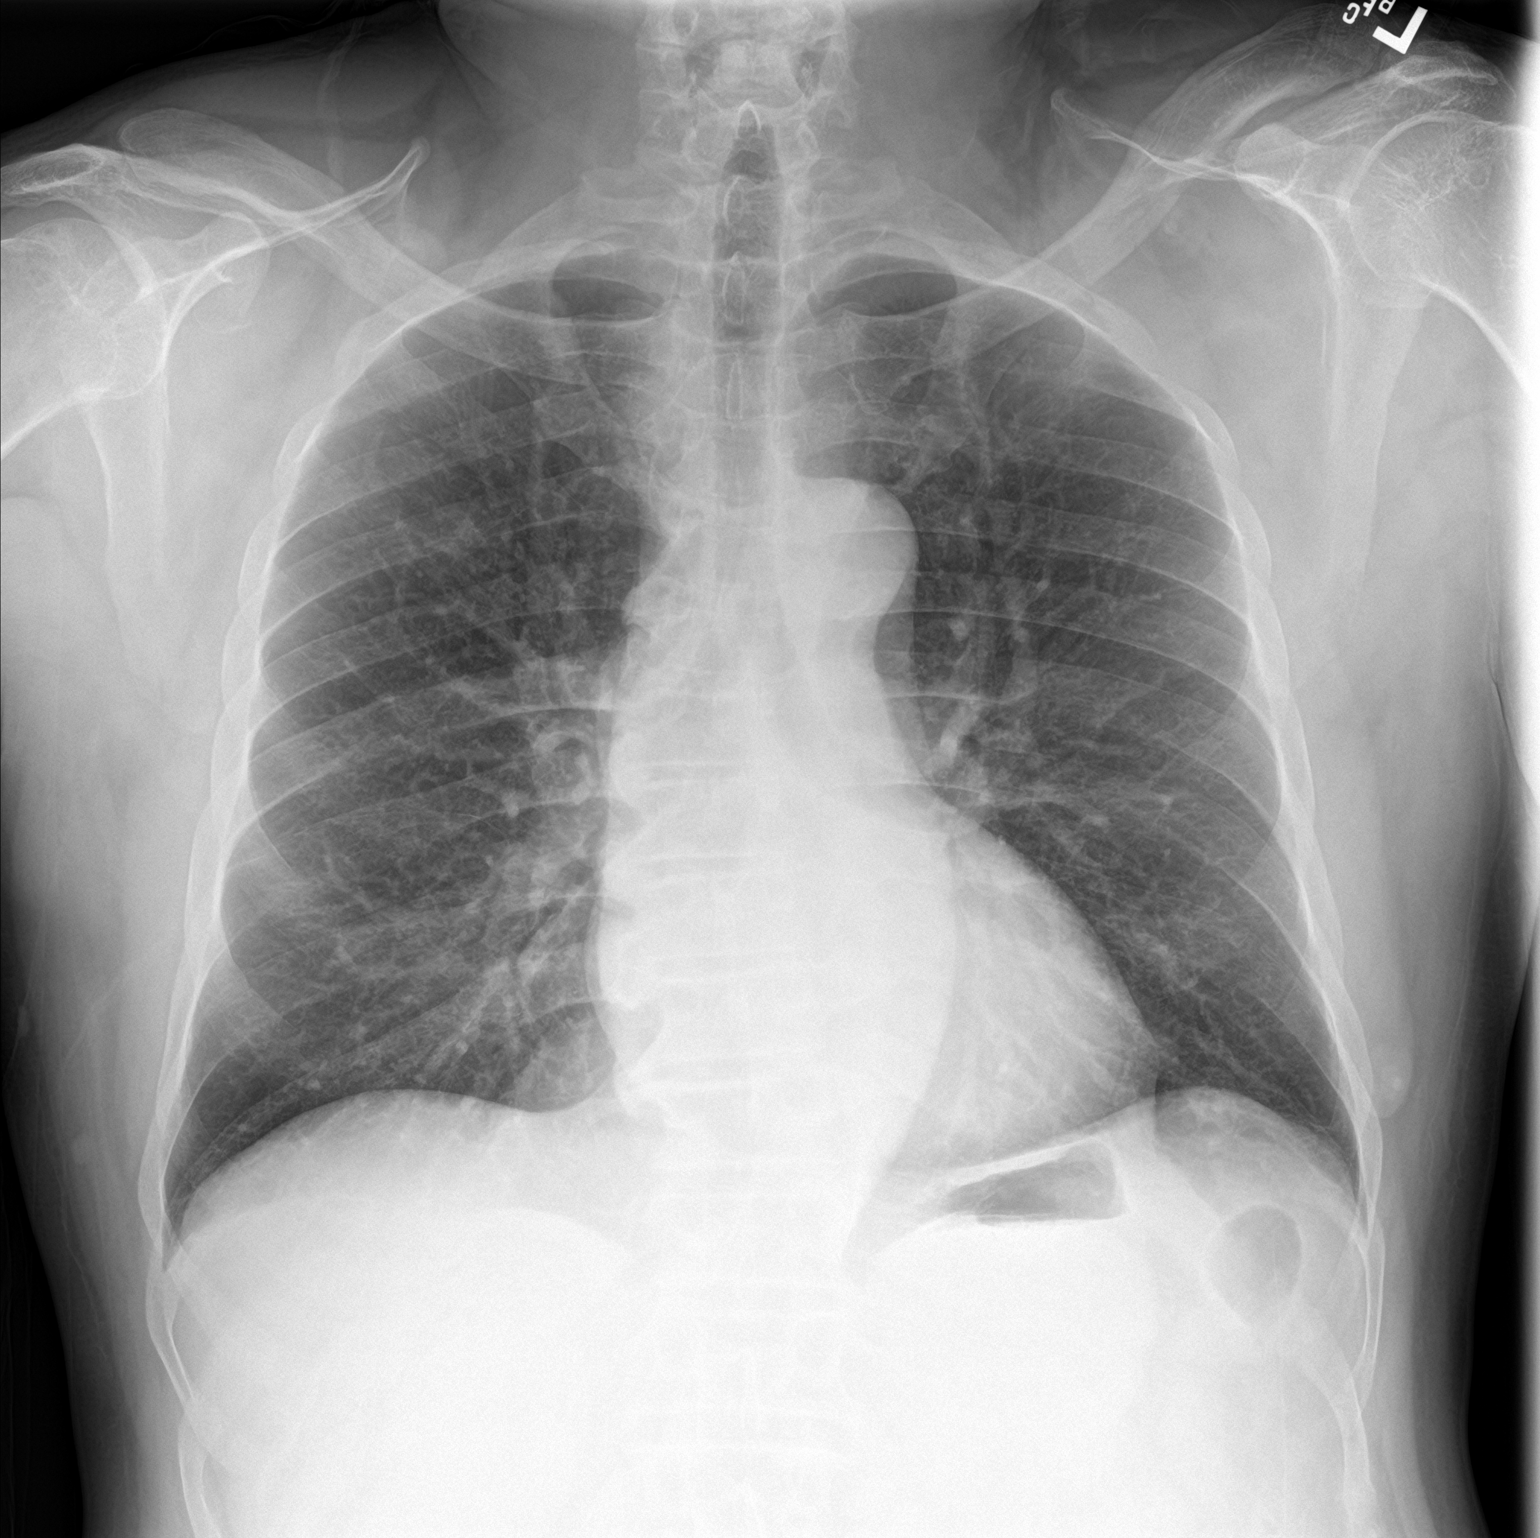

[chest lat]
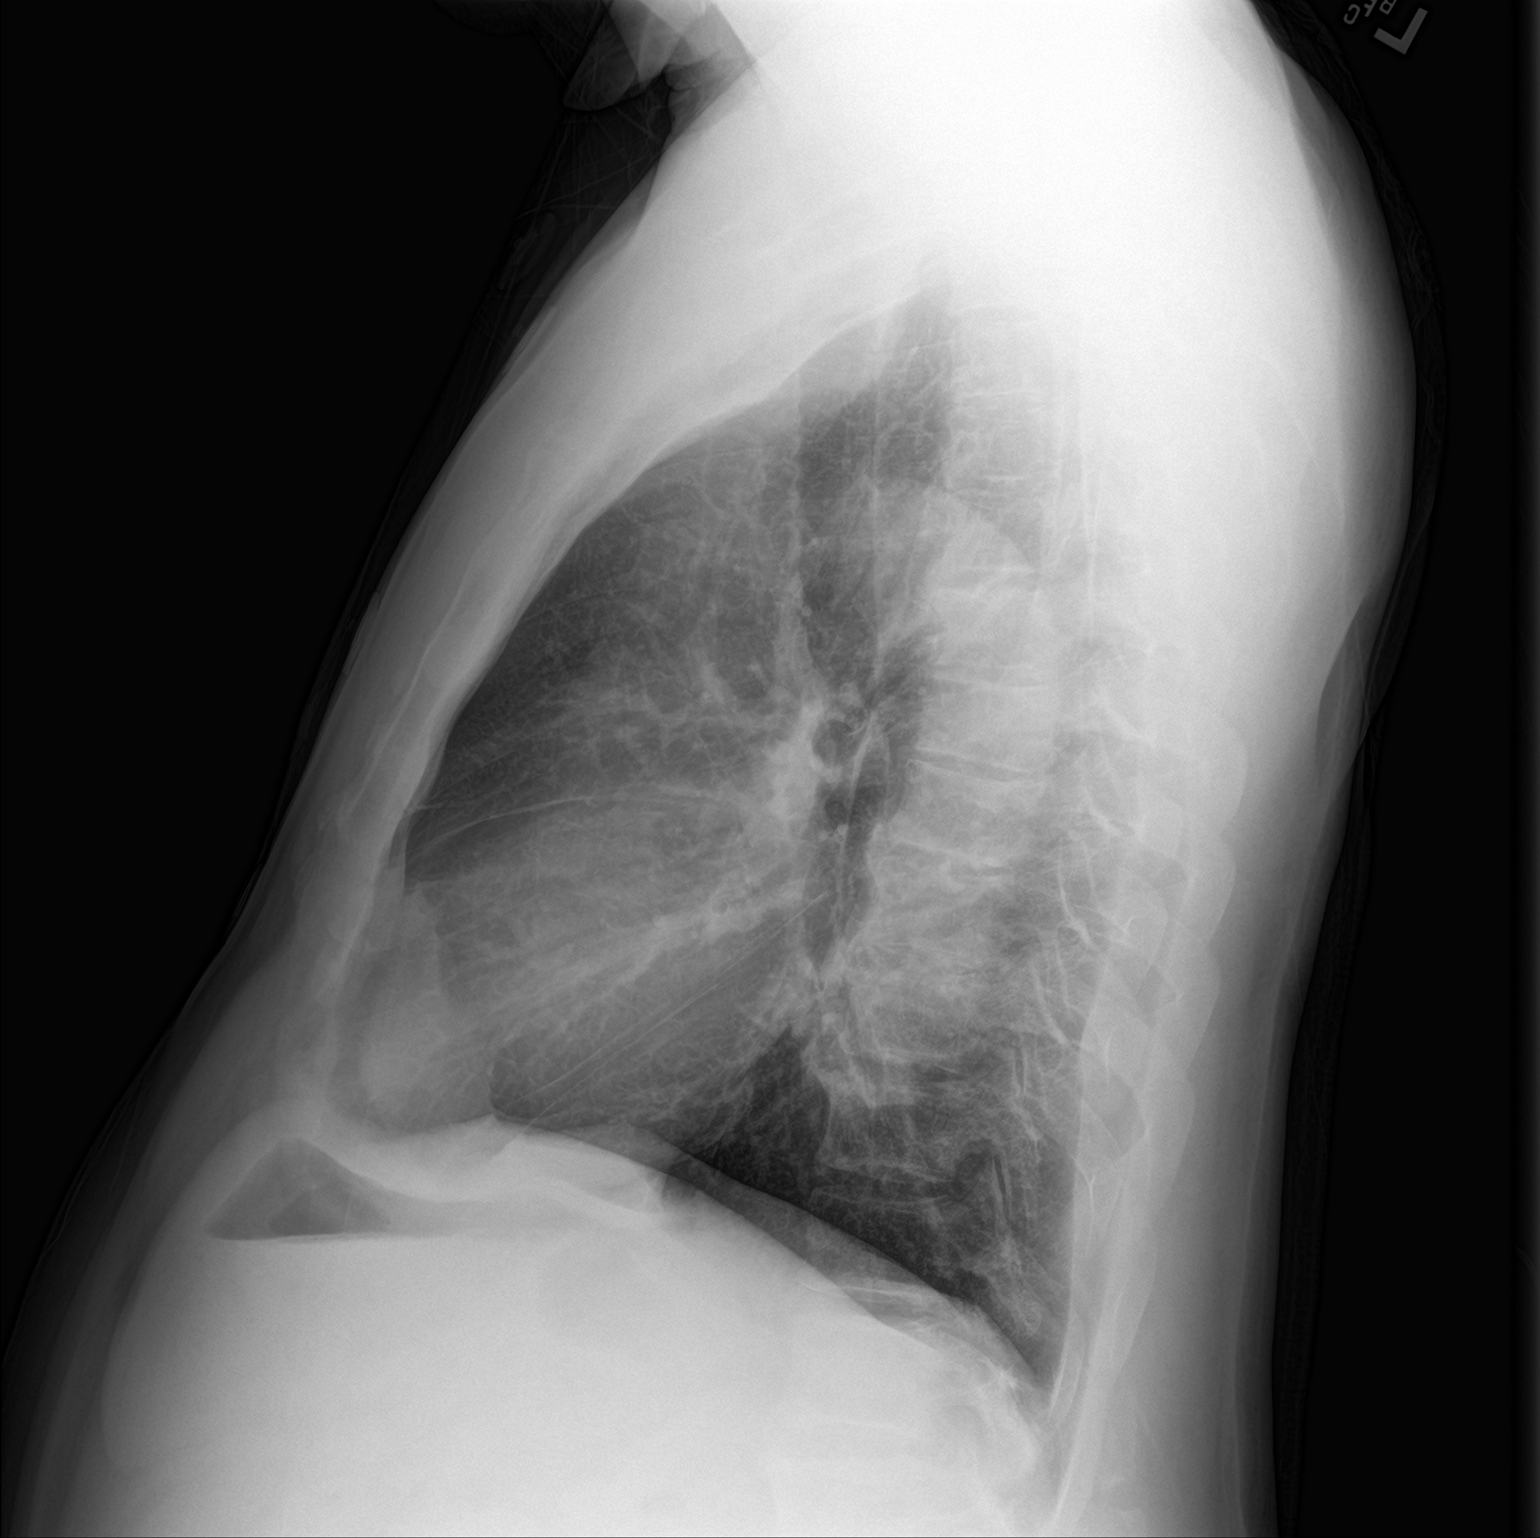

[2 of 2 positions shown; findings below may reference images not displayed]

FINDINGS: The heart is normal in size, mild tortuosity of the thoracic aorta.
Pulmonary vasculature is normal. No consolidation, pleural effusion,
or pneumothorax. No acute osseous abnormalities are seen.
Degenerative change in the thoracic spine.
IMPRESSION: No acute pulmonary process.

## 2017-11-05 NOTE — Progress Notes (Deleted)
No chief complaint on file.   HPI  4 review of systems  Past Medical History:  Diagnosis Date  . Allergy   . Arthritis   . Coronary artery disease    a. 01/2016: NSTEMI w/ 100% stenosis of Ost 2nd Diag --> POBA due to small vessel size, started on DAPT  . GERD (gastroesophageal reflux disease)   . History of blood transfusion 2006   "w/my back OR"  . History of hiatal hernia   . HLD (hyperlipidemia)    a. 01/2016: initiated on statin therapy  . HTN (hypertension)     Current Outpatient Medications  Medication Sig Dispense Refill  . acetaminophen (TYLENOL) 500 MG tablet Take 500 mg by mouth every 6 (six) hours as needed for mild pain or headache.    Marland Kitchen. aspirin EC 81 MG EC tablet Take 1 tablet (81 mg total) by mouth daily.    Marland Kitchen. atorvastatin (LIPITOR) 40 MG tablet Take 1 tablet (40 mg total) by mouth daily. 90 tablet 3  . fluticasone (FLONASE) 50 MCG/ACT nasal spray Place 2 sprays into both nostrils daily. 16 g 6  . loratadine (CLARITIN) 10 MG tablet Take 1 tablet (10 mg total) by mouth daily. 30 tablet 11  . olmesartan (BENICAR) 20 MG tablet Take 1 tablet (20 mg total) by mouth daily. 90 tablet 1  . ranitidine (ZANTAC) 150 MG tablet Take 150 mg by mouth daily.    . Saw Palmetto, Serenoa repens, 320 MG CAPS Take 320 mg by mouth daily.     No current facility-administered medications for this visit.     Allergies:  Allergies  Allergen Reactions  . Asa [Aspirin]     Burns stomach  . Lisinopril Cough    Past Surgical History:  Procedure Laterality Date  . BACK SURGERY    . CARDIAC CATHETERIZATION  2006  . CARDIAC CATHETERIZATION N/A 01/21/2016   Procedure: Left Heart Cath and Coronary Angiography;  Surgeon: Marykay Lexavid W Harding, MD;  [facility];  Service: Cardiovascular;  Laterality: N/A;  . CARDIAC CATHETERIZATION N/A 01/21/2016   Procedure: Coronary Stent Intervention;  Surgeon: Marykay Lexavid W Harding, MD;  [facility];  Service: Cardiovascular;   Laterality: N/A;  . CORONARY ANGIOPLASTY  01/21/2016  . POSTERIOR LUMBAR FUSION  2006   "L4-5"    Social History   Socioeconomic History  . Marital status: Married    Spouse name: Not on file  . Number of children: Not on file  . Years of education: Not on file  . Highest education level: Not on file  Social Needs  . Financial resource strain: Not on file  . Food insecurity - worry: Not on file  . Food insecurity - inability: Not on file  . Transportation needs - medical: Not on file  . Transportation needs - non-medical: Not on file  Occupational History  . Not on file  Tobacco Use  . Smoking status: Former Smoker    Packs/day: 0.12    Years: 48.00    Pack years: 5.76    Types: Cigarettes  . Smokeless tobacco: Never Used  Substance and Sexual Activity  . Alcohol use: No  . Drug use: No  . Sexual activity: Yes  Other Topics Concern  . Not on file  Social History Narrative  . Not on file    Family History  Problem Relation Age of Onset  . Cancer Mother        breast  . Heart disease Father  heart attack  . Cancer Sister        breast  . Heart disease Brother        heart attack  . Diabetes Sister      ROS Review of Systems See HPI Constitution: No fevers or chills No malaise No diaphoresis Skin: No rash or itching Eyes: no blurry vision, no double vision GU: no dysuria or hematuria Neuro: no dizziness or headaches * all others reviewed and negative   Objective: There were no vitals filed for this visit.  Physical Exam  Assessment and Plan There are no diagnoses linked to this encounter.   Shaquita [employer]

## 2017-11-09 ENCOUNTER — Ambulatory Visit: Payer: Medicare Other | Admitting: Family Medicine

## 2017-12-14 ENCOUNTER — Telehealth: Payer: Self-pay | Admitting: Family Medicine

## 2017-12-14 NOTE — Telephone Encounter (Signed)
Copied from CRM [id number]. Topic: Quick Communication - Rx Refill/Question >> Dec 14, 2017  2:57 PM Raquel SarnaHayes, Teresa G wrote: Pt cannot get Rx filled due to backorder at:  [facility] [address] - [place] ([address] [place] - [address] [place] [address] Phone: [phone] Fax: [phone]  Pt wants a call back to discuss.

## 2017-12-15 NOTE — Telephone Encounter (Signed)
Routed to stallings through telephone message

## 2017-12-15 NOTE — Telephone Encounter (Signed)
Left message for patient.  I am assuming it is his Benicar that is on back order from previous message.    Would you like to call something else in for patient?

## 2017-12-17 MED ORDER — OLMESARTAN MEDOXOMIL 20 MG PO TABS: 20.0000 mg | ORAL_TABLET | Freq: Every day | ORAL | 1 refills | Status: AC

## 2017-12-17 NOTE — Telephone Encounter (Signed)
Patient wanted benicar transferred to [facility]. DONE.

## 2017-12-17 NOTE — Addendum Note (Signed)
Addended by: Collie SiadSTALLINGS, Abner Ardis A on: 12/17/2017 01:29 PM   Modules accepted: Orders

## 2018-05-02 IMAGING — DX DG CHEST 2V
2 series · 2 of 2 positions shown · non-contrast
Comparison: None.

CLINICAL DATA: Left chest pain and arm tingling.

EXAM:
CHEST  2 VIEW

[chest pa]
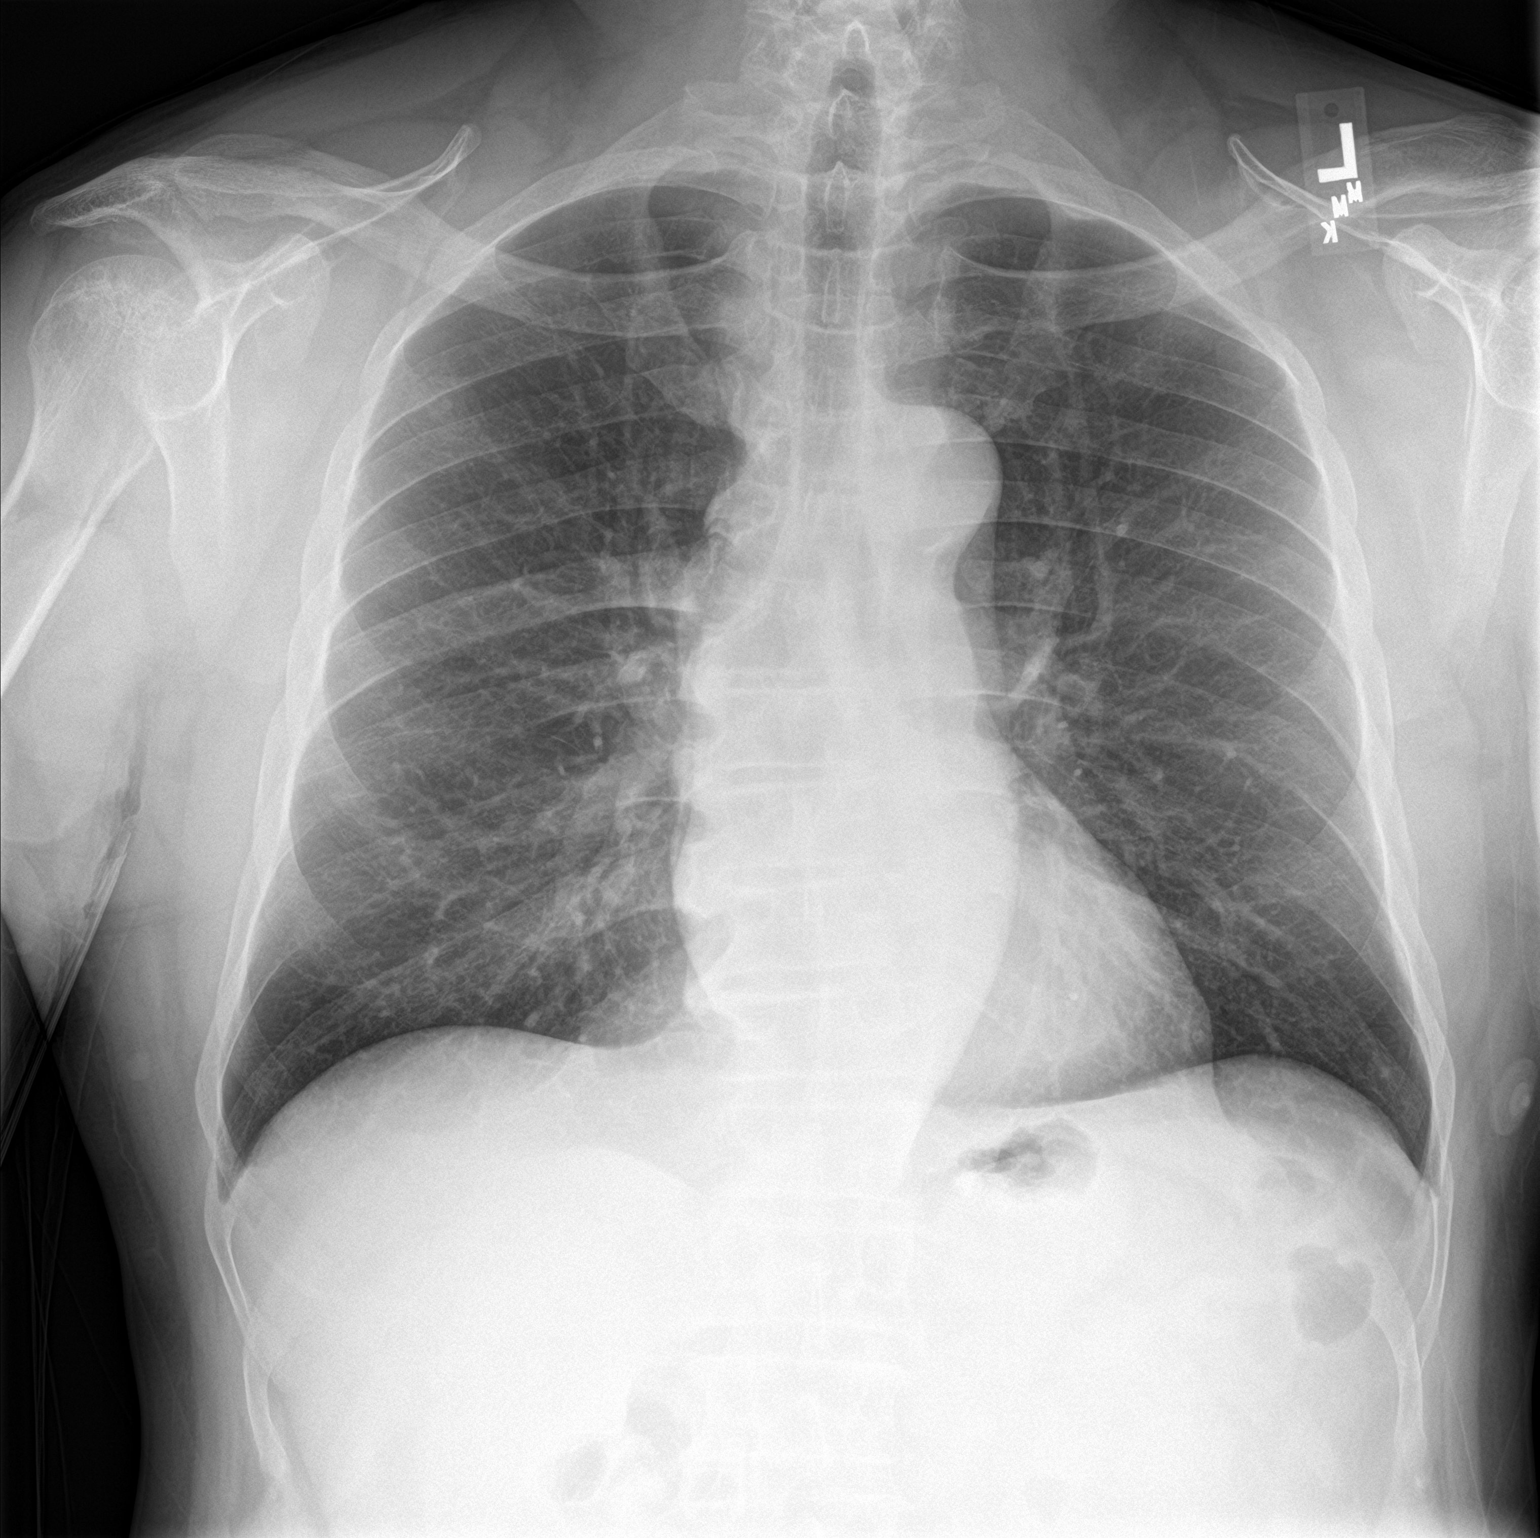

[chest lat]
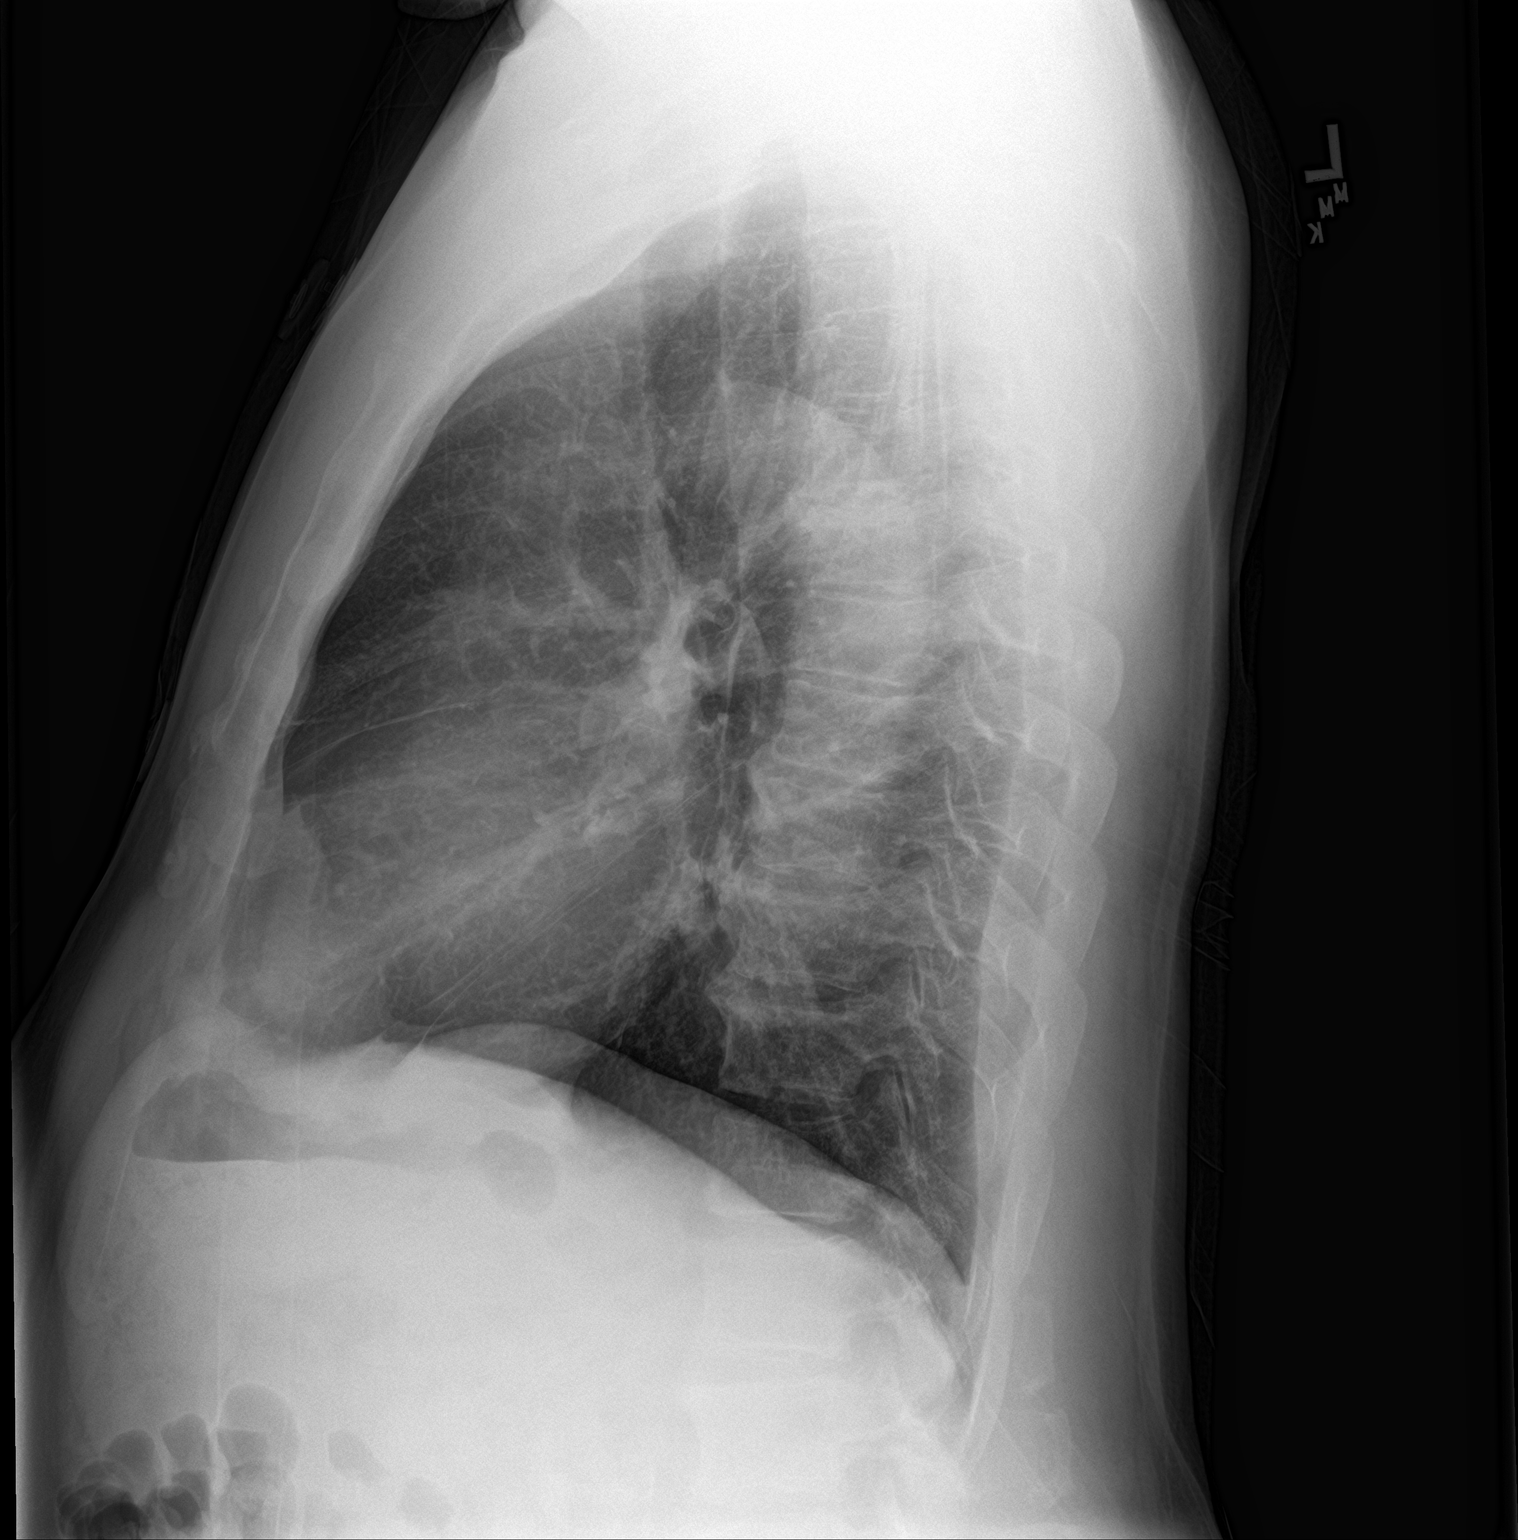

[2 of 2 positions shown; findings below may reference images not displayed]

FINDINGS: The heart size and mediastinal contours are within normal limits.
Both lungs are clear. The visualized skeletal structures are
unremarkable.
IMPRESSION: No active cardiopulmonary disease.
